# Patient Record
Sex: Male | Born: 1980
Health system: Southern US, Community
[De-identification: ages and names within clinical notes are randomized; demographics above are authoritative.]

## PROBLEM LIST (undated history)

## (undated) HISTORY — PX: APPENDECTOMY: SHX54

## (undated) HISTORY — PX: WISDOM TOOTH EXTRACTION: SHX21

---

## 2003-01-22 ENCOUNTER — Emergency Department (HOSPITAL_COMMUNITY): Admission: EM | Admit: 2003-01-22 | Discharge: 2003-01-22 | Payer: Self-pay | Admitting: Emergency Medicine

## 2004-02-15 ENCOUNTER — Emergency Department (HOSPITAL_COMMUNITY): Admission: EM | Admit: 2004-02-15 | Discharge: 2004-02-15 | Payer: Self-pay | Admitting: Emergency Medicine

## 2008-08-05 ENCOUNTER — Inpatient Hospital Stay (HOSPITAL_COMMUNITY): Admission: EM | Admit: 2008-08-05 | Discharge: 2008-08-06 | Payer: Self-pay | Admitting: Emergency Medicine

## 2008-08-05 ENCOUNTER — Encounter (INDEPENDENT_AMBULATORY_CARE_PROVIDER_SITE_OTHER): Payer: Self-pay

## 2010-12-27 NOTE — Op Note (Signed)
Trevor Simmons, Trevor Simmons NO.:  [id number]   MEDICAL RECORD NO.:  [id number]          PATIENT TYPE:  INP   LOCATION:  5028                         FACILITY:  [facility]   PHYSICIAN:  Sandria Bales. Ezzard Standing, M.D.  DATE OF BIRTH:  01-14-1981   DATE OF PROCEDURE:  08/04/2008  DATE OF DISCHARGE:                               OPERATIVE REPORT   Date of surgery ?   PREOPERATIVE DIAGNOSIS:  Acute appendicitis.   POSTOPERATIVE DIAGNOSIS:  Acute appendicitis.   PROCEDURE:  Laparoscopic appendectomy.   SURGEON:  Sandria Bales. Ezzard Standing, MD   FIRST ASSISTANT:  None.   ANESTHESIA:  General endotracheal with approximately 20 mL of 0.25%  Marcaine.   COMPLICATIONS:  None.   INDICATIONS FOR PROCEDURE:  [name] is a 30 year old Hispanic male  who developed abdominal pain over the last 24-36 hours, which has  localized to the right lower quadrant.  CT scan is consistent with acute  appendicitis.  The patient now comes for attempted laparoscopic  appendectomy.   Indications and potential complications of procedures were explained to  the patient.  Potential complication include, but limited bleeding,  infection, which I think he already has a bowel injury and open surgery.   OPERATIVE NOTE:  The patient was placed in a supine position, had his  left arm tucked to his side.  A Foley catheter in place.  His abdomen  was prepped with CHG and sterilely draped.  He was given 1 g of  cefoxitin.  A time-out was held identifying the patient and procedure.   I accessed the abdominal cavity through an infraumbilical incision with  sharp dissection carried down to the abdominal cavity.  I placed a 12 mm  Hasson tocar.  I placed a 5-mm trocar in the right upper quadrant and an  11-mm trocar in the left lower quadrant and proceeded with dissecting  out the appendix.   The appendix was stuck along the lateral aspect of the pelvic brim up  over to the right colon gutter and actually twisted under  the colon, got  actually free of adhesions just to untwist the appendix.  The distal tip  of the appendix was acutely inflamed consistent with acute appendicitis,  but there was no obvious purulence.   We took down the mesentery using a harmonic scalpel.  I then used a  vascular load of the Endo-GIA Ethicon 45-mm stapler to staple across the  base of the appendix.  I placed in EndoCatch bag and delivered through  the umbilicus.   I then irrigated the abdomen and pelvis and reinspected the appendiceal  stump, which looked good and healthy.  There was no bleeding.  I then  removed each trocar in turn.  I closed the umbilical port with a 0  Vicryl suture. I closed the skin edge site with a 5-0 Vicryl suture,  painted the wounds with the tincture of benzoin and Steri-Striped.  The  patient tolerated the procedure well.   He was transferred to recovery room in good condition.  Sponge and  needle count were correct at the end of case.  Sandria Bales. Ezzard Standing, M.D.  Electronically Signed     DHN/MEDQ  D:  08/05/2008  T:  08/05/2008  Job:  [id number]

## 2010-12-27 NOTE — H&P (Signed)
NAMEDELOYD, HANDY NO.:  [id number]   MEDICAL RECORD NO.:  [id number]          PATIENT TYPE:  INP   LOCATION:  1823                         FACILITY:  [facility]   PHYSICIAN:  Gabrielle Dare. Janee Morn, M.D.DATE OF BIRTH:  [birth year]   DATE OF ADMISSION:  08/04/2008  DATE OF DISCHARGE:                              HISTORY & PHYSICAL   CHIEF COMPLAINT:  Right lower quadrant abdominal pain.   HISTORY OF PRESENT ILLNESS:  Mr. Wild is a 30 year old Hispanic male  who developed generalized abdominal pain yesterday afternoon.  It  gradually worsened and localized to his right lower quadrant.  He had  some associated nausea and vomiting.  He came to [facility] Emergency  Department where further evaluation revealed white blood cell count of  14.9 and CT scan of the abdomen and pelvis was consistent with acute  appendicitis.  He continues to have some pain in the right lower  quadrant with associated nausea.   PAST MEDICAL HISTORY:  Negative.   PAST SURGICAL HISTORY:  Jaw surgery.   SOCIAL HISTORY:  He does not smoke or drink alcohol.  He denies drug  use.  He works [employer] representative.   ALLERGIES:  No known drug allergies.   FAMILY HISTORY:  Noncontributory.   CURRENT MEDICATIONS:  None.   REVIEW OF SYSTEMS:  GI complaints as above, right lower quadrant  abdominal pain with associated nausea and vomiting.  The remainder  review of systems was unremarkable.   PHYSICAL EXAMINATION:  VITAL SIGNS:  Temperature is 98.1, pulse is 69,  respirations is 18, and blood pressure is 104/54.  GENERAL:  He is awake and alert.  He is mildly anxious, but in no  distress.  HEENT:  Pupils are equal.  Sclerae are clear.  Oral mucous is moist.  NECK:  Supple with no masses or tenderness.  PULMONARY:  Lungs are clear to auscultation without significant  wheezing.  Respiratory effort is good.  CARDIOVASCULAR:  Heart is regular with no murmurs heard and pulses  palpable on the left chest.   Distal pulses are 2+ with no peripheral  edema.  ABDOMEN:  Soft.  He does have tenderness in the right lower quadrant  with voluntary guarding.  There is no generalized peritoneal signs.  There are few bowel sounds.  No masses or organomegaly are palpated.  SKIN:  Warm and dry.  No rashes are noted.  EXTREMITIES:  No deformity or tenderness.  NEUROLOGIC:  He is awake and alert.  Speech is fluent in [place].  He  moves all extremities without gross deficit.   LABORATORY STUDIES:  White blood cell count is 14.9, hemoglobin 13.1,  and platelets 209.  Sodium 139, potassium 3.3, chloride 103, CO2 24, BUN  15, creatinine 1.05, and glucose 130.   CT scan of the abdomen and pelvis demonstrates acute appendicitis.   IMPRESSION:  Acute appendicitis.   PLAN:  IV antibiotics, and we will plan laparoscopic appendectomy later  this morning when the OR is available.  Procedure, risks, and benefits  were discussed in detail with the patient and his wife in Spanish by  myself.  Questions were answered, and he is agreeable.      Gabrielle Dare Janee Morn, M.D.  Electronically Signed     BET/MEDQ  D:  08/05/2008  T:  08/05/2008  Job:  [id number]

## 2011-05-19 LAB — URINALYSIS, ROUTINE W REFLEX MICROSCOPIC
Glucose, UA: NEGATIVE mg/dL
Ketones, ur: >80 mg/dL — AB
Leukocytes, UA: NEGATIVE
Protein, ur: 30 mg/dL — AB
Urobilinogen, UA: 1 mg/dL (ref 0.0–1.0)

## 2011-05-19 LAB — COMPREHENSIVE METABOLIC PANEL
ALT: 13 U/L (ref 0–53)
Alkaline Phosphatase: 41 U/L (ref 39–117)
BUN: 15 mg/dL (ref 6–23)
CO2: 24 mEq/L (ref 19–32)
Chloride: 103 mEq/L (ref 96–112)
GFR calc non Af Amer: >60 mL/min (ref >60)
Glucose, Bld: 130 mg/dL — ABNORMAL HIGH (ref 70–99)
Potassium: 3.3 mEq/L — ABNORMAL LOW (ref 3.5–5.1)
Sodium: 139 mEq/L (ref 135–145)
Total Bilirubin: 1.3 mg/dL — ABNORMAL HIGH (ref 0.3–1.2)

## 2011-05-19 LAB — DIFFERENTIAL
Basophils Absolute: 0 10*3/uL (ref 0.0–0.1)
Basophils Relative: 0 % (ref 0–1)
Eosinophils Absolute: 0 10*3/uL (ref 0.0–0.7)
Monocytes Relative: 3 % (ref 3–12)
Neutro Abs: 13.5 10*3/uL — ABNORMAL HIGH (ref 1.7–7.7)
Neutrophils Relative %: 91 % — ABNORMAL HIGH (ref 43–77)

## 2011-05-19 LAB — URINE MICROSCOPIC-ADD ON

## 2011-05-19 LAB — CBC
HCT: 44.8 % (ref 39.0–52.0)
Hemoglobin: 15.1 g/dL (ref 13.0–17.0)
RBC: 5.31 MIL/uL (ref 4.22–5.81)

## 2017-08-19 ENCOUNTER — Encounter (HOSPITAL_COMMUNITY)
Admission: EM | Disposition: A | Discharge status: Home / Self Care | Payer: Self-pay | Source: Home / Self Care | Attending: Emergency Medicine

## 2017-08-19 ENCOUNTER — Encounter (HOSPITAL_COMMUNITY): Payer: Self-pay

## 2017-08-19 ENCOUNTER — Ambulatory Visit (HOSPITAL_COMMUNITY)
Admission: EM | Admit: 2017-08-19 | Discharge: 2017-08-19 | Disposition: A | Discharge status: Home / Self Care | Payer: Self-pay | Attending: Orthopedic Surgery | Admitting: Orthopedic Surgery

## 2017-08-19 ENCOUNTER — Emergency Department (HOSPITAL_COMMUNITY): Payer: Self-pay | Admitting: Certified Registered Nurse Anesthetist

## 2017-08-19 ENCOUNTER — Emergency Department (HOSPITAL_COMMUNITY): Payer: Self-pay

## 2017-08-19 DIAGNOSIS — S66326A Laceration of extensor muscle, fascia and tendon of right little finger at wrist and hand level, initial encounter: Secondary | ICD-10-CM | POA: Insufficient documentation

## 2017-08-19 DIAGNOSIS — Y9289 Other specified places as the place of occurrence of the external cause: Secondary | ICD-10-CM | POA: Insufficient documentation

## 2017-08-19 DIAGNOSIS — B957 Other staphylococcus as the cause of diseases classified elsewhere: Secondary | ICD-10-CM | POA: Insufficient documentation

## 2017-08-19 DIAGNOSIS — T148XXA Other injury of unspecified body region, initial encounter: Secondary | ICD-10-CM

## 2017-08-19 DIAGNOSIS — S61219A Laceration without foreign body of unspecified finger without damage to nail, initial encounter: Secondary | ICD-10-CM

## 2017-08-19 DIAGNOSIS — S61216A Laceration without foreign body of right little finger without damage to nail, initial encounter: Secondary | ICD-10-CM

## 2017-08-19 DIAGNOSIS — Y990 Civilian activity done for income or pay: Secondary | ICD-10-CM | POA: Insufficient documentation

## 2017-08-19 DIAGNOSIS — L089 Local infection of the skin and subcutaneous tissue, unspecified: Secondary | ICD-10-CM

## 2017-08-19 DIAGNOSIS — W260XXA Contact with knife, initial encounter: Secondary | ICD-10-CM | POA: Insufficient documentation

## 2017-08-19 DIAGNOSIS — S62616B Displaced fracture of proximal phalanx of right little finger, initial encounter for open fracture: Secondary | ICD-10-CM | POA: Insufficient documentation

## 2017-08-19 DIAGNOSIS — Y939 Activity, unspecified: Secondary | ICD-10-CM | POA: Insufficient documentation

## 2017-08-19 HISTORY — PX: I&D EXTREMITY: SHX5045

## 2017-08-19 LAB — CBC WITH DIFFERENTIAL/PLATELET
Basophils Absolute: 0.1 10*3/uL (ref 0.0–0.1)
Basophils Relative: 1 %
EOS ABS: 0.5 10*3/uL (ref 0.0–0.7)
Eosinophils Relative: 5 %
HCT: 41.7 % (ref 39.0–52.0)
HEMOGLOBIN: 14 g/dL (ref 13.0–17.0)
Lymphocytes Relative: 24 %
Lymphs Abs: 2.1 10*3/uL (ref 0.7–4.0)
MCH: 28.3 pg (ref 26.0–34.0)
MCHC: 33.6 g/dL (ref 30.0–36.0)
MCV: 84.2 fL (ref 78.0–100.0)
MONO ABS: 1 10*3/uL (ref 0.1–1.0)
MONOS PCT: 11 %
NEUTROS PCT: 59 %
Neutro Abs: 5.4 10*3/uL (ref 1.7–7.7)
Platelets: 191 10*3/uL (ref 150–400)
RBC: 4.95 MIL/uL (ref 4.22–5.81)
RDW: 12.5 % (ref 11.5–15.5)
WBC: 8.9 10*3/uL (ref 4.0–10.5)

## 2017-08-19 LAB — BASIC METABOLIC PANEL
Anion gap: 9 (ref 5–15)
BUN: 13 mg/dL (ref 6–20)
CHLORIDE: 101 mmol/L (ref 101–111)
CO2: 24 mmol/L (ref 22–32)
CREATININE: 0.9 mg/dL (ref 0.61–1.24)
Calcium: 8.9 mg/dL (ref 8.9–10.3)
GFR calc Af Amer: >60 mL/min (ref >60)
GFR calc non Af Amer: >60 mL/min (ref >60)
Glucose, Bld: 104 mg/dL — ABNORMAL HIGH (ref 65–99)
Potassium: 3.7 mmol/L (ref 3.5–5.1)
SODIUM: 134 mmol/L — AB (ref 135–145)

## 2017-08-19 SURGERY — IRRIGATION AND DEBRIDEMENT EXTREMITY
Anesthesia: General | Site: Arm Lower | Laterality: Right

## 2017-08-19 MED ORDER — CEFAZOLIN SODIUM-DEXTROSE 2-4 GM/100ML-% IV SOLN
2.0000 g | Freq: Once | INTRAVENOUS | Status: AC
Start: 2017-08-19 — End: 2017-08-19
  Administered 2017-08-19: 2 g via INTRAVENOUS
  Filled 2017-08-19: qty 100

## 2017-08-19 MED ORDER — HYDROCODONE-ACETAMINOPHEN 5-325 MG PO TABS: ORAL_TABLET | ORAL | 0 refills | Status: DC

## 2017-08-19 MED ORDER — PROPOFOL 10 MG/ML IV BOLUS
INTRAVENOUS | Status: DC | PRN
  Administered 2017-08-19: 200 mg via INTRAVENOUS

## 2017-08-19 MED ORDER — LIDOCAINE HCL (CARDIAC) 20 MG/ML IV SOLN
INTRAVENOUS | Status: DC | PRN
  Administered 2017-08-19: 60 mg via INTRAVENOUS

## 2017-08-19 MED ORDER — FENTANYL CITRATE (PF) 100 MCG/2ML IJ SOLN
INTRAMUSCULAR | Status: DC | PRN
  Administered 2017-08-19 (×2): 25 ug via INTRAVENOUS
  Administered 2017-08-19: 50 ug via INTRAVENOUS

## 2017-08-19 MED ORDER — TETANUS-DIPHTH-ACELL PERTUSSIS 5-2.5-18.5 LF-MCG/0.5 IM SUSP
0.5000 mL | Freq: Once | INTRAMUSCULAR | Status: AC
  Administered 2017-08-19: 0.5 mL via INTRAMUSCULAR
  Filled 2017-08-19: qty 0.5

## 2017-08-19 MED ORDER — FENTANYL CITRATE (PF) 250 MCG/5ML IJ SOLN
INTRAMUSCULAR | Status: AC
  Filled 2017-08-19: qty 5

## 2017-08-19 MED ORDER — BUPIVACAINE HCL (PF) 0.25 % IJ SOLN
INTRAMUSCULAR | Status: DC | PRN
  Administered 2017-08-19: 10 mL

## 2017-08-19 MED ORDER — PROMETHAZINE HCL 25 MG/ML IJ SOLN: 6.2500 mg | INTRAMUSCULAR | Status: DC | PRN

## 2017-08-19 MED ORDER — ONDANSETRON HCL 4 MG/2ML IJ SOLN
INTRAMUSCULAR | Status: DC | PRN
  Administered 2017-08-19: 4 mg via INTRAVENOUS

## 2017-08-19 MED ORDER — OXYCODONE-ACETAMINOPHEN 5-325 MG PO TABS
1.0000 | ORAL_TABLET | Freq: Once | ORAL | Status: AC
  Administered 2017-08-19: 1 via ORAL
  Filled 2017-08-19: qty 1

## 2017-08-19 MED ORDER — SULFAMETHOXAZOLE-TRIMETHOPRIM 800-160 MG PO TABS: 1.0000 | ORAL_TABLET | Freq: Two times a day (BID) | ORAL | 0 refills | Status: DC

## 2017-08-19 MED ORDER — PROPOFOL 10 MG/ML IV BOLUS
INTRAVENOUS | Status: AC
  Filled 2017-08-19: qty 20

## 2017-08-19 MED ORDER — 0.9 % SODIUM CHLORIDE (POUR BTL) OPTIME
TOPICAL | Status: DC | PRN
  Administered 2017-08-19: 1000 mL

## 2017-08-19 MED ORDER — SODIUM CHLORIDE 0.9 % IR SOLN
Status: DC | PRN
  Administered 2017-08-19: 3000 mL

## 2017-08-19 MED ORDER — MIDAZOLAM HCL 5 MG/5ML IJ SOLN
INTRAMUSCULAR | Status: DC | PRN
  Administered 2017-08-19: 2 mg via INTRAVENOUS

## 2017-08-19 MED ORDER — CEFAZOLIN SODIUM-DEXTROSE 1-4 GM/50ML-% IV SOLN
INTRAVENOUS | Status: DC | PRN
  Administered 2017-08-19: 2 g via INTRAVENOUS

## 2017-08-19 MED ORDER — MIDAZOLAM HCL 2 MG/2ML IJ SOLN
INTRAMUSCULAR | Status: AC
  Filled 2017-08-19: qty 2

## 2017-08-19 MED ORDER — LACTATED RINGERS IV SOLN
INTRAVENOUS | Status: DC | PRN
  Administered 2017-08-19: 11:00:00 via INTRAVENOUS

## 2017-08-19 MED ORDER — BUPIVACAINE HCL (PF) 0.5 % IJ SOLN
10.0000 mL | Freq: Once | INTRAMUSCULAR | Status: AC
  Administered 2017-08-19: 10 mL
  Filled 2017-08-19 (×2): qty 10

## 2017-08-19 MED ORDER — HYDROMORPHONE HCL 1 MG/ML IJ SOLN: 0.2500 mg | INTRAMUSCULAR | Status: DC | PRN

## 2017-08-19 SURGICAL SUPPLY — 48 items
BANDAGE ACE 3X5.8 VEL STRL LF (GAUZE/BANDAGES/DRESSINGS) ×3 IMPLANT
BANDAGE ACE 4X5 VEL STRL LF (GAUZE/BANDAGES/DRESSINGS) ×3 IMPLANT
BANDAGE COBAN STERILE 2 (GAUZE/BANDAGES/DRESSINGS) IMPLANT
BNDG COHESIVE 1X5 TAN STRL LF (GAUZE/BANDAGES/DRESSINGS) ×3 IMPLANT
BNDG ESMARK 4X9 LF (GAUZE/BANDAGES/DRESSINGS) IMPLANT
BNDG GAUZE ELAST 4 BULKY (GAUZE/BANDAGES/DRESSINGS) ×3 IMPLANT
CORDS BIPOLAR (ELECTRODE) ×3 IMPLANT
COVER SURGICAL LIGHT HANDLE (MISCELLANEOUS) ×3 IMPLANT
CUFF TOURNIQUET SINGLE 18IN (TOURNIQUET CUFF) IMPLANT
CUFF TOURNIQUET SINGLE 24IN (TOURNIQUET CUFF) IMPLANT
DECANTER SPIKE VIAL GLASS SM (MISCELLANEOUS) ×3 IMPLANT
DRAIN PENROSE 1/4X12 LTX STRL (WOUND CARE) IMPLANT
DRSG PAD ABDOMINAL 8X10 ST (GAUZE/BANDAGES/DRESSINGS) ×6 IMPLANT
GAUZE PACKING IODOFORM 1/4X15 (GAUZE/BANDAGES/DRESSINGS) ×3 IMPLANT
GAUZE SPONGE 4X4 12PLY STRL (GAUZE/BANDAGES/DRESSINGS) ×3 IMPLANT
GAUZE SPONGE 4X4 16PLY XRAY LF (GAUZE/BANDAGES/DRESSINGS) ×3 IMPLANT
GAUZE XEROFORM 1X8 LF (GAUZE/BANDAGES/DRESSINGS) ×3 IMPLANT
GLOVE BIO SURGEON STRL SZ7.5 (GLOVE) ×3 IMPLANT
GLOVE BIOGEL PI IND STRL 8 (GLOVE) ×1 IMPLANT
GLOVE BIOGEL PI INDICATOR 8 (GLOVE) ×2
GOWN STRL REUS W/ TWL LRG LVL3 (GOWN DISPOSABLE) ×1 IMPLANT
GOWN STRL REUS W/ TWL XL LVL3 (GOWN DISPOSABLE) ×1 IMPLANT
GOWN STRL REUS W/TWL LRG LVL3 (GOWN DISPOSABLE) ×2
GOWN STRL REUS W/TWL XL LVL3 (GOWN DISPOSABLE) ×2
KIT BASIN OR (CUSTOM PROCEDURE TRAY) ×3 IMPLANT
KIT ROOM TURNOVER OR (KITS) ×3 IMPLANT
LOOP VESSEL MAXI BLUE (MISCELLANEOUS) IMPLANT
MANIFOLD NEPTUNE II (INSTRUMENTS) IMPLANT
NEEDLE HYPO 25X1 1.5 SAFETY (NEEDLE) IMPLANT
NS IRRIG 1000ML POUR BTL (IV SOLUTION) ×3 IMPLANT
PACK ORTHO EXTREMITY (CUSTOM PROCEDURE TRAY) ×3 IMPLANT
PAD ARMBOARD 7.5X6 YLW CONV (MISCELLANEOUS) ×6 IMPLANT
SCRUB BETADINE 4OZ XXX (MISCELLANEOUS) ×3 IMPLANT
SET CYSTO W/LG BORE CLAMP LF (SET/KITS/TRAYS/PACK) IMPLANT
SOL PREP POV-IOD 4OZ 10% (MISCELLANEOUS) ×3 IMPLANT
SPONGE LAP 4X18 X RAY DECT (DISPOSABLE) ×3 IMPLANT
SUT ETHILON 4 0 P 3 18 (SUTURE) ×3 IMPLANT
SUT ETHILON 4 0 PS 2 18 (SUTURE) IMPLANT
SUT MON AB 5-0 P3 18 (SUTURE) IMPLANT
SWAB COLLECTION DEVICE MRSA (MISCELLANEOUS) IMPLANT
SWAB CULTURE ESWAB REG 1ML (MISCELLANEOUS) IMPLANT
SYR CONTROL 10ML LL (SYRINGE) IMPLANT
TOWEL OR 17X26 10 PK STRL BLUE (TOWEL DISPOSABLE) ×3 IMPLANT
TUBE CONNECTING 12'X1/4 (SUCTIONS) ×1
TUBE CONNECTING 12X1/4 (SUCTIONS) ×2 IMPLANT
TUBE FEEDING ENTERAL 5FR 16IN (TUBING) IMPLANT
UNDERPAD 30X30 (UNDERPADS AND DIAPERS) ×3 IMPLANT
YANKAUER SUCT BULB TIP NO VENT (SUCTIONS) ×3 IMPLANT

## 2017-08-19 NOTE — Anesthesia Procedure Notes (Signed)
Procedure Name: LMA Insertion Date/Time: 08/19/2017 11:27 AM Performed by: Dairl PonderJiang, Jonessa Triplett, CRNA Pre-anesthesia Checklist: Patient identified, Emergency Drugs available, Suction available, Patient being monitored and Timeout performed Patient Re-evaluated:Patient Re-evaluated prior to induction Oxygen Delivery Method: Circle system utilized Preoxygenation: Pre-oxygenation with 100% oxygen Induction Type: IV induction LMA: LMA inserted LMA Size: 4.0 Number of attempts: 1 Placement Confirmation: positive ETCO2 and breath sounds checked- equal and bilateral Tube secured with: Tape Dental Injury: Teeth and Oropharynx as per pre-operative assessment

## 2017-08-19 NOTE — Discharge Instructions (Addendum)

## 2017-08-19 NOTE — H&P (Addendum)
Trevor Simmons is an 37 y.o. male.   Chief Complaint: right small finger laceration HPI: 37 yo rhd male states he lacerated right small five days ago on knife at work.  Did not seek medical attention at that time.  Presented to ED this morning with pain in finger.  He speaks good english.  Describes throbbing pain in right small finger with associated swelling.  Alleviated with digital block.  Aggravated by palpation.  No fevers, chills, night sweats.  Case discussed with Charlann Lange, [facility] and her note from 08/19/2017 reviewed. Xrays viewed and interpreted by me: 3 views right small finger show no fractures, dislocations, foreign body noted proximal to laceration in hand.  No signs osteomyelitis. Labs reviewed: WBC 8.9  Allergies: No Known Allergies  History reviewed. No pertinent past medical history.  Past Surgical History:  Procedure Laterality Date  . APPENDECTOMY      Family History: No family history on file.  Social History:   reports that  has never smoked. he has never used smokeless tobacco. He reports that he does not drink alcohol or use drugs.  Medications:  (Not in a hospital admission)  Results for orders placed or performed during the hospital encounter of 08/19/17 (from the past 48 hour(s))  CBC with Differential     Status: None   Collection Time: 08/19/17  6:49 AM  Result Value Ref Range   WBC 8.9 4.0 - 10.5 K/uL   RBC 4.95 4.22 - 5.81 MIL/uL   Hemoglobin 14.0 13.0 - 17.0 g/dL   HCT 41.7 39.0 - 52.0 %   MCV 84.2 78.0 - 100.0 fL   MCH 28.3 26.0 - 34.0 pg   MCHC 33.6 30.0 - 36.0 g/dL   RDW 12.5 11.5 - 15.5 %   Platelets 191 150 - 400 K/uL   Neutrophils Relative % 59 %   Neutro Abs 5.4 1.7 - 7.7 K/uL   Lymphocytes Relative 24 %   Lymphs Abs 2.1 0.7 - 4.0 K/uL   Monocytes Relative 11 %   Monocytes Absolute 1.0 0.1 - 1.0 K/uL   Eosinophils Relative 5 %   Eosinophils Absolute 0.5 0.0 - 0.7 K/uL   Basophils Relative 1 %   Basophils Absolute 0.1 0.0 -  0.1 K/uL  Basic metabolic panel     Status: Abnormal   Collection Time: 08/19/17  6:49 AM  Result Value Ref Range   Sodium 134 (L) 135 - 145 mmol/L   Potassium 3.7 3.5 - 5.1 mmol/L   Chloride 101 101 - 111 mmol/L   CO2 24 22 - 32 mmol/L   Glucose, Bld 104 (H) 65 - 99 mg/dL   BUN 13 6 - 20 mg/dL   Creatinine, Ser 0.90 0.61 - 1.24 mg/dL   Calcium 8.9 8.9 - 10.3 mg/dL   GFR calc non Af Amer >60 >60 mL/min   GFR calc Af Amer >60 >60 mL/min    Comment: (NOTE) The eGFR has been calculated using the CKD EPI equation. This calculation has not been validated in all clinical situations. eGFR's persistently <60 mL/min signify possible Chronic Kidney Disease.    Anion gap 9 5 - 15    No results found.   A comprehensive review of systems was negative except for: Gastrointestinal: positive for nausea Review of Systems: No fevers, chills, night sweats, chest pain, shortness of breath, vomiting, diarrhea, constipation, easy bleeding or bruising, headaches, dizziness, vision changes, fainting.   Blood pressure 103/76, pulse 72, temperature 98.2 F (36.8 C), temperature  source Oral, resp. rate 16, SpO2 100 %.  General appearance: alert, cooperative and appears stated age Head: Normocephalic, without obvious abnormality, atraumatic Neck: supple, symmetrical, trachea midline Resp: clear to auscultation bilaterally Cardio: regular rate and rhythm GI: non-tender Extremities: Intact sensation and capillary refill all digits.  +epl/fpl/io.  Laceration dorsum right small finger at level of PIP joint.  Swelling and mild erythema surrounding.  Purulence coming from wound.  No proximal streaking.  Tender at PIP joint.  Able to move digit limited by swelling.   Pulses: 2+ and symmetric Skin: Skin color, texture, turgor normal. No rashes or lesions Neurologic: Grossly normal Incision/Wound: As above  Assessment/Plan Right small finger infection possibly involving PIP joint.  Recommend OR for incision  and drainage.  Risks, benefits and alternatives of surgery were discussed including risks of blood loss, infection, damage to nerves/vessels/tendons/ligament/bone, failure of surgery, need for additional surgery, complication with wound healing, need for repeat incision and drainage, stiffness.  He voiced understanding of these risks and elected to proceed.    KUZMA,KEVIN R 08/19/2017, 10:38 AM

## 2017-08-19 NOTE — Anesthesia Preprocedure Evaluation (Addendum)
Anesthesia Evaluation  Patient identified by MRN, date of birth, ID band Patient awake    Reviewed: Allergy & Precautions, NPO status , Patient's Chart, lab work & pertinent test results  Airway Mallampati: II  TM Distance: >3 FB Neck ROM: Full    Dental  (+) Dental Advisory Given   Pulmonary neg pulmonary ROS,    breath sounds clear to auscultation       Cardiovascular negative cardio ROS   Rhythm:Regular Rate:Normal     Neuro/Psych negative neurological ROS     GI/Hepatic negative GI ROS,   Endo/Other    Renal/GU negative Renal ROS     Musculoskeletal Right small finger infection   Abdominal   Peds  Hematology negative hematology ROS (+)   Anesthesia Other Findings   Reproductive/Obstetrics                             Anesthesia Physical Anesthesia Plan  ASA: II  Anesthesia Plan: General   Post-op Pain Management:    Induction: Intravenous  PONV Risk Score and Plan: 2 and Ondansetron, Dexamethasone and Treatment may vary due to age or medical condition  Airway Management Planned: LMA and Oral ETT  Additional Equipment:   Intra-op Plan:   Post-operative Plan: Extubation in OR  Informed Consent: I have reviewed the patients History and Physical, chart, labs and discussed the procedure including the risks, benefits and alternatives for the proposed anesthesia with the patient or authorized representative who has indicated his/her understanding and acceptance.   Dental advisory given  Plan Discussed with: CRNA  Anesthesia Plan Comments:        Anesthesia Quick Evaluation

## 2017-08-19 NOTE — Op Note (Signed)
NAMESHAMEER, MOLSTAD NO.:  [id number]  MEDICAL RECORD NO.:  [id number]  LOCATION:  P11C                         FACILITY:  [facility]  PHYSICIAN:  Betha Loa, MD        DATE OF BIRTH:  1981-08-03  DATE OF PROCEDURE:  08/19/2017 DATE OF DISCHARGE:                              OPERATIVE REPORT   PREOPERATIVE DIAGNOSIS:  Right small finger laceration with abscess.  POSTOPERATIVE DIAGNOSIS:  Right small finger laceration with open proximal phalanx condyle fracture and infection including proximal interphalangeal joint.  PROCEDURE:  Incision and drainage of right small finger abscess with irrigation and debridement of open proximal phalanx fracture and PIP joint.  SURGEON:  Betha Loa, MD.  ASSISTANT:  None.  ANESTHESIA:  General.  IV FLUIDS:  Per anesthesia flow sheet.  ESTIMATED BLOOD LOSS:  Minimal.  COMPLICATIONS:  None.  SPECIMENS:  Cultures to Micro.  TOURNIQUET TIME:  13 minutes.  DISPOSITION:  Stable to PACU.  INDICATIONS:  Mr. Wherry is a 37 year old right-hand dominant male, who states 5 days ago while at work he sustained a laceration to his right small finger from a utility knife.  He did not seek any medical treatment initially.  He presented to the emergency department early this morning with increasing pain and swelling of the finger.  On examination by the emergency department, there was purulence within the wound.  I was consulted for management of the injury.  I recommended incision and drainage in the operating room to possible include the PIP joint.  Risks, benefits, and alternatives of the surgery were discussed including risks of blood loss, infection, damage to nerves, vessels, tendons, ligaments, and bone, failure of surgery, need for additional surgery, complications with wound healing, continued pain, and need for further surgery.  He voiced understanding these risks and elected to proceed.  OPERATIVE COURSE:  After being  identified preoperatively by myself, the patient and I agreed upon the procedure and site of procedure.  Surgical site was marked.  The risks, benefits, and alternatives of surgery were reviewed and he wished to proceed.  Surgical consent had been signed. He had been given antibiotics in the emergency department.  Additional antibiotics were held for cultures.  He was transferred to the operating room and placed on the operating room table in supine position with the right upper extremity on an arm board.  General anesthesia was induced by anesthesiologist.  Right upper extremity was prepped and draped in normal sterile orthopedic fashion.  A surgical pause was performed between surgeons, Anesthesia, and operating room staff; and all were in agreement as to the patient procedure and site of procedure.  Tourniquet at the proximal aspect of the extremity was inflated to 250 mmHg after exsanguination of the limb with an Esmarch bandage.  The wound was opened.  There was a small amount of purulence within.  Cultures were taken for aerobes and anaerobes.  There was noted to be laceration of the ulnar side of the extensor tendon over the distal aspect of the proximal phalanx.  The wound was debrided of some hematoma and the skin edges sharply debrided with the scissors including the dermis and some subcutaneous tissues.  This tissue  was removed.  The PIP joint was exposed.  There was laceration over the dorsal aspect of the ulnar condyle of the proximal phalanx.  The wound and open proximal phalanx fracture of the PIP joint were copiously irrigated with sterile saline by cysto tubing.  Single suture was placed proximally in the wound to prevent the flap from flipping back over on itself as this was a distally-based L-shaped flap.  The wound and PIP joint were packed with quarter-inch iodoform gauze, which exited on the ulnar side.  A digital block was performed with 10 mL of 0.25% plain Marcaine  to aid in postoperative analgesia.  He was given a dose of IV Ancef in the operating suite after cultures had been taken.  The wound was dressed with sterile 4 x 4's and wrapped with a Kerlix Coban dressing lightly. Alumafoam splint was placed and wrapped lightly with Coban dressing. Tourniquet was deflated at 13 minutes.  Fingertips were pink with brisk capillary refill after deflation of tourniquet.  The operative drapes were broken down and the patient was awakened from anesthesia safely. He was transferred back to stretcher and taken to PACU in stable condition.  I will see him back in the office in approximately 3 days for postoperative followup.  I will give him a prescription for Norco 5/325 one to two p.o. q.6 hours p.r.n. pain dispensed #20 and Bactrim DS 1 p.o. b.i.d. x7 days.     Betha LoaKevin Danilo Cappiello, MD     KK/MEDQ  D:  08/19/2017  T:  08/19/2017  Job:  [id number]

## 2017-08-19 NOTE — Op Note (Signed)
[id number] 

## 2017-08-19 NOTE — ED Provider Notes (Signed)
[facility] Provider Note   CSN: [id number] Arrival date & time: 08/19/17  [id number]     History   Chief Complaint Chief Complaint  Patient presents with  . Laceration    HPI Trevor Simmons is a 37 y.o. male.  Patient presents with finger laceration to right 5th finger that he sustained while at work 5 days ago. He presents tonight with increased pain and swelling. Laceration is located over the 5th PIP joint. He states it keeps getting knocked open and bleeds but denies seeing any pus. No fever, nausea, vomiting. Moving the finger is very painful which he states is worse when he walks with his arm at his side. No other complaints. He is not UTD on tetanus.   The history is provided by the patient and the spouse. No language interpreter was used.  Laceration      History reviewed. No pertinent past medical history.  There are no active problems to display for this patient.   Past Surgical History:  Procedure Laterality Date  . APPENDECTOMY         Home Medications    Prior to Admission medications   Not on File    Family History No family history on file.  Social History Social History   Tobacco Use  . Smoking status: Never Smoker  . Smokeless tobacco: Never Used  Substance Use Topics  . Alcohol use: No    Frequency: Never  . Drug use: No     Allergies   Patient has no known allergies.   Review of Systems Review of Systems  Constitutional: Negative for fever.  Musculoskeletal:       See HPI.  Skin: Positive for wound. Negative for color change.  Neurological: Negative for numbness.     Physical Exam Updated Vital Signs BP 125/76 (BP Location: Right Arm)   Pulse 77   Temp 98.1 F (36.7 C) (Oral)   Resp 15   SpO2 100%   Physical Exam  Constitutional: He is oriented to person, place, and time. He appears well-developed and well-nourished. No distress.  Neck: Normal range of motion.    Cardiovascular: Normal rate.  Pulmonary/Chest: Effort normal.  Musculoskeletal:  See Skin examination  Neurological: He is alert and oriented to person, place, and time.  Skin: Skin is warm and dry. Capillary refill takes less than 2 seconds.  Right 5th finger is moderately swollen over the proximal phalanx. Minimal erythema. Patient cannot move through ROM with flexion or extension and the finger is generally tender. Minimal tenderness to hand dorsum and palm. No red streaking into the hand.      ED Treatments / Results  Labs (all labs ordered are listed, but only abnormal results are displayed) Labs Reviewed - No data to display  EKG  EKG Interpretation None       Radiology No results found.  Procedures .Marland Kitchen.Laceration Repair Date/Time: 08/19/2017 6:50 AM Performed by: Elpidio AnisUpstill, Jhayla Podgorski, PA-C Authorized by: Elpidio AnisUpstill, Caitlin Hillmer, PA-C   Consent:    Consent obtained:  Verbal   Consent given by:  Patient   Risks discussed:  Tendon damage, retained foreign body and need for additional repair Anesthesia (see MAR for exact dosages):    Anesthesia method:  Local infiltration   Local anesthetic:  Bupivacaine 0.5% w/o epi Laceration details:    Location:  Finger   Finger location:  R small finger   Length (cm):  1.5 Comments:     Plan for cleaning and splinting  finger for outpatient follow up. During wound cleaning, purulent material is visualized coming from the wound. Dr. Merlyn Lot is consulted and procedure was stopped due to need to go to the OR for wash out.    (including critical care time)  Medications Ordered in ED Medications  bupivacaine (MARCAINE) 0.5 % injection 10 mL (not administered)  oxyCODONE-acetaminophen (PERCOCET/ROXICET) 5-325 MG per tablet 1 tablet (1 tablet Oral Given 08/19/17 0602)  Tdap (BOOSTRIX) injection 0.5 mL (0.5 mLs Intramuscular Given 08/19/17 0602)     Initial Impression / Assessment and Plan / ED Course  I have reviewed the triage vital signs and the  nursing notes.  Pertinent labs & imaging results that were available during my care of the patient were reviewed by me and considered in my medical decision making (see chart for details).     Patient here for evaluation of increasing right 5th finger pain and swelling since sustaining a dorsal laceration 5 days ago. No fever.   The has increased but not full ROM to flexion and extension after digital block. When would was being cleaned a purulent discharge was seen. Dr. Merlyn Lot was consulted and advises he would be in to see the patient, provide consult and take him to the OR later this morning. NPO orders entered. 1 gm Ancef ordered. Patient and family updated. Tetanus updated.   Final Clinical Impressions(s) / ED Diagnoses   Final diagnoses:  None   1. Right 5th finger laceration, delayed presentation 2. Wound infection  ED Discharge Orders    None       Elpidio Anis, Cordelia Poche 08/19/17 [id number]    Dione Booze, MD 08/19/17 [phone]

## 2017-08-19 NOTE — ED Triage Notes (Signed)
Pt states that he cut his R pinky finger on the 1st with a knife at work, last tetanus unknown, denies fevers.

## 2017-08-19 NOTE — Brief Op Note (Signed)
08/19/2017  11:53 AM  PATIENT:  Trevor Simmons  37 y.o. male  PRE-OPERATIVE DIAGNOSIS: right small finger infection  POST-OPERATIVE DIAGNOSIS: right small finger infection  PROCEDURE:  Procedure(s): IRRIGATION AND DEBRIDEMENT SMALL FINGER (Right)  SURGEON:  Surgeon(s) and Role:    Betha Loa* Sarah-Jane Nazario, MD - Primary  PHYSICIAN ASSISTANT:   ASSISTANTS: none   ANESTHESIA:   general  EBL:  Minimal  BLOOD ADMINISTERED:none  DRAINS: iodoform packing  LOCAL MEDICATIONS USED:  MARCAINE     SPECIMEN:  Source of Specimen:  right small finger  DISPOSITION OF SPECIMEN:  micro  COUNTS:  YES  TOURNIQUET:  Right arm: 13 minutes at 250 mmHg  DICTATION: .Other Dictation: Dictation Number [id number]  PLAN OF CARE: Discharge to home after PACU  PATIENT DISPOSITION:  PACU - hemodynamically stable.

## 2017-08-19 NOTE — Anesthesia Postprocedure Evaluation (Signed)
Anesthesia Post Note  Patient: Trevor Simmons  Procedure(s) Performed: IRRIGATION AND DEBRIDEMENT SMALL FINGER (Right Arm Lower)     Patient location during evaluation: PACU Anesthesia Type: General Level of consciousness: awake and alert Pain management: pain level controlled Vital Signs Assessment: post-procedure vital signs reviewed and stable Respiratory status: spontaneous breathing, nonlabored ventilation, respiratory function stable and patient connected to nasal cannula oxygen Cardiovascular status: blood pressure returned to baseline and stable Postop Assessment: no apparent nausea or vomiting Anesthetic complications: no    Last Vitals:  Vitals:   08/19/17 1301 08/19/17 1303  BP:  106/64  Pulse: (!) 53 (!) 55  Resp: 10 16  Temp:  (!) 36.3 C  SpO2: 100% 100%    Last Pain:  Vitals:   08/19/17 1303  TempSrc:   PainSc: 0-No pain                 Kennieth RadFitzgerald, Rawlin Reaume E

## 2017-08-19 NOTE — Transfer of Care (Signed)
Immediate Anesthesia Transfer of Care Note  Patient: Trevor Simmons  Procedure(s) Performed: IRRIGATION AND DEBRIDEMENT SMALL FINGER (Right Arm Lower)  Patient Location: PACU  Anesthesia Type:General  Level of Consciousness: drowsy and patient cooperative  Airway & Oxygen Therapy: Patient Spontanous Breathing and Patient connected to nasal cannula oxygen  Post-op Assessment: Report given to RN and Post -op Vital signs reviewed and stable  Post vital signs: Reviewed and stable  Last Vitals:  Vitals:   08/19/17 1030 08/19/17 1209  BP: 103/76   Pulse: 72 (!) 54  Resp:  11  Temp:    SpO2: 100% 100%    Last Pain:  Vitals:   08/19/17 0826  TempSrc: Oral  PainSc:          Complications: No apparent anesthesia complications

## 2017-08-20 ENCOUNTER — Encounter (HOSPITAL_COMMUNITY): Payer: Self-pay | Admitting: Orthopedic Surgery

## 2017-08-24 LAB — AEROBIC/ANAEROBIC CULTURE W GRAM STAIN (SURGICAL/DEEP WOUND)

## 2017-08-24 LAB — AEROBIC/ANAEROBIC CULTURE (SURGICAL/DEEP WOUND)

## 2019-08-29 ENCOUNTER — Ambulatory Visit: Payer: Self-pay | Attending: Nurse Practitioner | Admitting: Nurse Practitioner

## 2019-08-29 ENCOUNTER — Encounter: Payer: Self-pay | Admitting: Nurse Practitioner

## 2019-08-29 ENCOUNTER — Other Ambulatory Visit: Payer: Self-pay

## 2019-08-29 DIAGNOSIS — Z13220 Encounter for screening for lipoid disorders: Secondary | ICD-10-CM

## 2019-08-29 DIAGNOSIS — Z131 Encounter for screening for diabetes mellitus: Secondary | ICD-10-CM

## 2019-08-29 DIAGNOSIS — R21 Rash and other nonspecific skin eruption: Secondary | ICD-10-CM

## 2019-08-29 DIAGNOSIS — Z7689 Persons encountering health services in other specified circumstances: Secondary | ICD-10-CM

## 2019-08-29 DIAGNOSIS — Z13228 Encounter for screening for other metabolic disorders: Secondary | ICD-10-CM

## 2019-08-29 DIAGNOSIS — Z130 Encounter for screening for diseases of the blood and blood-forming organs and certain disorders involving the immune mechanism: Secondary | ICD-10-CM

## 2019-08-29 DIAGNOSIS — L298 Other pruritus: Secondary | ICD-10-CM

## 2019-08-29 MED ORDER — HYDROXYZINE HCL 10 MG PO TABS: 10.0000 mg | ORAL_TABLET | Freq: Three times a day (TID) | ORAL | 1 refills | Status: DC | PRN

## 2019-08-29 MED ORDER — PREDNISONE 20 MG PO TABS: 20.0000 mg | ORAL_TABLET | Freq: Every day | ORAL | 0 refills | Status: AC

## 2019-08-29 MED FILL — predniSONE 20 MG TABS: 20 | 7 days supply | Qty: 7 | Fill #0

## 2019-08-29 MED FILL — hydrOXYzine HCL 10 MG TABS: 10 | 20 days supply | Qty: 60 | Fill #0

## 2019-08-29 NOTE — Progress Notes (Signed)
Virtual Visit via Telephone Note Due to national recommendations of social distancing due to [place] 19, telehealth visit is felt to be most appropriate for this patient at this time.  I discussed the limitations, risks, security and privacy concerns of performing an evaluation and management service by telephone and the availability of in person appointments. I also discussed with the patient that there may be a patient responsible charge related to this service. The patient expressed understanding and agreed to proceed.    I connected with Trevor Simmons on 08/29/19  at   1:50 PM EST  EDT by telephone and verified that I am speaking with the correct person using two identifiers.   Consent I discussed the limitations, risks, security and privacy concerns of performing an evaluation and management service by telephone and the availability of in person appointments. I also discussed with the patient that there may be a patient responsible charge related to this service. The patient expressed understanding and agreed to proceed.   Location of Patient: Private Residence   Location of Provider: [place] participating in Telemedicine visit: Geryl Rankins FNP-BC [place]    History of Present Illness: Telemedicine visit for: Establish Care No significant past medical history   Rash Patient complains of rash involving the abdomen, thighs, groin and buttocks. Rash started a few years ago.Discomfort associated with rash: is pruritic.  Associated symptoms: none. Denies: fever, nausea, vomiting and purulent drainage. Patient has had previous evaluation of rash and was prescribed a topical cream however can not recall the name of the cream. Response to treatment: good. Patient has not had contacts with similar rash. Patient has not identified precipitant. Patient has not had new exposures (soaps, lotions, laundry detergents, foods,  medications, plants, insects or animals.) He is currently using        History reviewed. No pertinent past medical history.  Past Surgical History:  Procedure Laterality Date  . APPENDECTOMY    . I & D EXTREMITY Right 08/19/2017   Procedure: IRRIGATION AND DEBRIDEMENT SMALL FINGER;  Surgeon: Leanora Cover, MD;  Location: [facility];  Service: Orthopedics;  Laterality: Right;  . WISDOM TOOTH EXTRACTION      Family History  Problem Relation Age of Onset  . Diabetes Neg Hx   . Hypertension Neg Hx     Social History   Socioeconomic History  . Marital status: Married    Spouse name: Not on file  . Number of children: Not on file  . Years of education: Not on file  . Highest education level: Not on file  Occupational History  . Not on file  Tobacco Use  . Smoking status: Never Smoker  . Smokeless tobacco: Never Used  Substance and Sexual Activity  . Alcohol use: No  . Drug use: No  . Sexual activity: Not Currently  Other Topics Concern  . Not on file  Social History Narrative  . Not on file   Social Determinants of Health   Financial Resource Strain:   . Difficulty of Paying Living Expenses: Not on file  Food Insecurity:   . Worried About Charity fundraiser in the Last Year: Not on file  . Ran Out of Food in the Last Year: Not on file  Transportation Needs:   . Lack of Transportation (Medical): Not on file  . Lack of Transportation (Non-Medical): Not on file  Physical Activity:   . Days of Exercise per Week: Not  on file  . Minutes of Exercise per Session: Not on file  Stress:   . Feeling of Stress : Not on file  Social Connections:   . Frequency of Communication with Friends and Family: Not on file  . Frequency of Social Gatherings with Friends and Family: Not on file  . Attends Religious Services: Not on file  . Active Member of Clubs or Organizations: Not on file  . Attends Archivist Meetings: Not on file  . Marital Status: Not on file      Observations/Objective: Awake, alert and oriented x 3   Review of Systems  Constitutional: Negative for fever, malaise/fatigue and weight loss.  HENT: Negative.  Negative for nosebleeds.   Eyes: Negative.  Negative for blurred vision, double vision and photophobia.  Respiratory: Negative.  Negative for cough and shortness of breath.   Cardiovascular: Negative.  Negative for chest pain, palpitations and leg swelling.  Gastrointestinal: Negative.  Negative for heartburn, nausea and vomiting.  Musculoskeletal: Negative.  Negative for myalgias.  Skin: Positive for itching and rash.  Neurological: Negative.  Negative for dizziness, focal weakness, seizures and headaches.  Psychiatric/Behavioral: Negative.  Negative for suicidal ideas.    Assessment and Plan: Trevor Simmons was seen today for new patient (initial visit).  Diagnoses and all orders for this visit:  Encounter to establish care  Lipid screening -     Lipid Panel  Screening for deficiency anemia -     CBC  Screening for metabolic disorder -     [id number]  Encounter for screening for diabetes mellitus -     A1c  Skin rash -     HIV antibody (with reflex) -     hydrOXYzine (ATARAX/VISTARIL) 10 MG tablet; Take 1 tablet (10 mg total) by mouth 3 (three) times daily as needed for itching. -     predniSONE (DELTASONE) 20 MG tablet; Take 1 tablet (20 mg total) by mouth daily with breakfast for 7 days.     Follow Up Instructions Return in about 2 weeks (around 09/12/2019) for SKIN RASH.     I discussed the assessment and treatment plan with the patient. The patient was provided an opportunity to ask questions and all were answered. The patient agreed with the plan and demonstrated an understanding of the instructions.   The patient was advised to call back or seek an in-person evaluation if the symptoms worsen or if the condition fails to improve as anticipated.  I provided 14 minutes of non-face-to-face time during this  encounter including median intraservice time, reviewing previous notes, labs, imaging, medications and explaining diagnosis and management.  Gildardo Pounds, FNP-BC

## 2019-08-30 LAB — CMP14+EGFR
ALT: 11 IU/L (ref 0–44)
AST: 13 IU/L (ref 0–40)
Albumin/Globulin Ratio: 2 (ref 1.2–2.2)
Albumin: 4.5 g/dL (ref 4.0–5.0)
Alkaline Phosphatase: 54 IU/L (ref 39–117)
BUN/Creatinine Ratio: 14 (ref 9–20)
BUN: 14 mg/dL (ref 6–20)
Bilirubin Total: 0.6 mg/dL (ref 0.0–1.2)
CO2: 27 mmol/L (ref 20–29)
Calcium: 9.3 mg/dL (ref 8.7–10.2)
Chloride: 101 mmol/L (ref 96–106)
Creatinine, Ser: 1 mg/dL (ref 0.76–1.27)
GFR calc Af Amer: 111 mL/min/{1.73_m2} (ref >59)
GFR calc non Af Amer: 96 mL/min/{1.73_m2} (ref >59)
Globulin, Total: 2.3 g/dL (ref 1.5–4.5)
Glucose: 48 mg/dL — ABNORMAL LOW (ref 65–99)
Potassium: 4.1 mmol/L (ref 3.5–5.2)
Sodium: 141 mmol/L (ref 134–144)
Total Protein: 6.8 g/dL (ref 6.0–8.5)

## 2019-08-30 LAB — CBC
Hematocrit: 45.5 % (ref 37.5–51.0)
Hemoglobin: 14.8 g/dL (ref 13.0–17.7)
MCH: 27.6 pg (ref 26.6–33.0)
MCHC: 32.5 g/dL (ref 31.5–35.7)
MCV: 85 fL (ref 79–97)
Platelets: 255 10*3/uL (ref 150–450)
RBC: 5.36 x10E6/uL (ref 4.14–5.80)
RDW: 13.1 % (ref 11.6–15.4)
WBC: 6.4 10*3/uL (ref 3.4–10.8)

## 2019-08-30 LAB — LIPID PANEL
Chol/HDL Ratio: 4.7 ratio (ref 0.0–5.0)
Cholesterol, Total: 206 mg/dL — ABNORMAL HIGH (ref 100–199)
HDL: 44 mg/dL (ref >39)
LDL Chol Calc (NIH): 131 mg/dL — ABNORMAL HIGH (ref 0–99)
Triglycerides: 172 mg/dL — ABNORMAL HIGH (ref 0–149)
VLDL Cholesterol Cal: 31 mg/dL (ref 5–40)

## 2019-08-30 LAB — HIV ANTIBODY (ROUTINE TESTING W REFLEX): HIV Screen 4th Generation wRfx: NONREACTIVE

## 2019-08-30 LAB — HEMOGLOBIN A1C
Est. average glucose Bld gHb Est-mCnc: 108 mg/dL
Hgb A1c MFr Bld: 5.4 % (ref 4.8–5.6)

## 2019-08-31 ENCOUNTER — Encounter: Payer: Self-pay | Admitting: Nurse Practitioner

## 2019-09-16 ENCOUNTER — Ambulatory Visit: Payer: Self-pay | Admitting: Nurse Practitioner

## 2019-09-19 ENCOUNTER — Ambulatory Visit: Payer: Self-pay

## 2019-11-12 ENCOUNTER — Encounter (HOSPITAL_COMMUNITY): Payer: Self-pay | Admitting: Emergency Medicine

## 2019-11-12 ENCOUNTER — Emergency Department (HOSPITAL_COMMUNITY): Payer: No Typology Code available for payment source

## 2019-11-12 ENCOUNTER — Emergency Department (HOSPITAL_COMMUNITY)
Admission: EM | Admit: 2019-11-12 | Discharge: 2019-11-12 | Disposition: A | Discharge status: Home / Self Care | Payer: No Typology Code available for payment source | Attending: Emergency Medicine | Admitting: Emergency Medicine

## 2019-11-12 DIAGNOSIS — M542 Cervicalgia: Secondary | ICD-10-CM | POA: Diagnosis present

## 2019-11-12 DIAGNOSIS — R519 Headache, unspecified: Secondary | ICD-10-CM | POA: Diagnosis not present

## 2019-11-12 DIAGNOSIS — Y92410 Unspecified street and highway as the place of occurrence of the external cause: Secondary | ICD-10-CM | POA: Insufficient documentation

## 2019-11-12 DIAGNOSIS — M546 Pain in thoracic spine: Secondary | ICD-10-CM | POA: Insufficient documentation

## 2019-11-12 DIAGNOSIS — Y999 Unspecified external cause status: Secondary | ICD-10-CM | POA: Insufficient documentation

## 2019-11-12 DIAGNOSIS — Y93I9 Activity, other involving external motion: Secondary | ICD-10-CM | POA: Diagnosis not present

## 2019-11-12 MED ORDER — CYCLOBENZAPRINE HCL 10 MG PO TABS: 10.0000 mg | ORAL_TABLET | Freq: Every day | ORAL | 0 refills | Status: AC

## 2019-11-12 MED ORDER — NAPROXEN 375 MG PO TABS: 375.0000 mg | ORAL_TABLET | Freq: Two times a day (BID) | ORAL | 0 refills | Status: AC

## 2019-11-12 NOTE — Discharge Instructions (Addendum)
You have been prescribed 2 medications.  The first medication is Flexeril which is a muscle relaxant.  This medication is sedating, so make sure to take it at night with dinner for help with your pain as well as to relieve your difficulty sleeping due to pain.  Do not operate a motor vehicle or heavy machinery after taking this medication.  You are also being prescribed naproxen which is a strong anti-inflammatory please take this twice a day as needed for management of your pain.  Please do not hesitate to return to the emergency department with any new or worsening symptoms.  Please make sure to follow-up with your primary care provider regarding this visit.

## 2019-11-12 NOTE — ED Notes (Signed)
Pt transported to CT ?
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
## 2019-11-12 NOTE — ED Notes (Signed)
Patient verbalizes understanding of discharge instructions. Opportunity for questioning and answers were provided. Armband removed by staff, pt discharged from ED ambulatory.   

## 2020-04-02 ENCOUNTER — Ambulatory Visit: Payer: Self-pay

## 2020-10-31 ENCOUNTER — Emergency Department (HOSPITAL_COMMUNITY): Payer: Self-pay

## 2020-10-31 ENCOUNTER — Encounter (HOSPITAL_COMMUNITY): Payer: Self-pay

## 2020-10-31 ENCOUNTER — Other Ambulatory Visit: Payer: Self-pay

## 2020-10-31 ENCOUNTER — Inpatient Hospital Stay (HOSPITAL_COMMUNITY)
Admission: EM | Admit: 2020-10-31 | Discharge: 2020-11-04 | DRG: 142 | Disposition: A | Discharge status: Home Health | Payer: Self-pay | Attending: Internal Medicine | Admitting: Internal Medicine

## 2020-10-31 DIAGNOSIS — R519 Headache, unspecified: Secondary | ICD-10-CM

## 2020-10-31 DIAGNOSIS — Z20822 Contact with and (suspected) exposure to covid-19: Secondary | ICD-10-CM | POA: Diagnosis present

## 2020-10-31 DIAGNOSIS — E785 Hyperlipidemia, unspecified: Secondary | ICD-10-CM | POA: Diagnosis present

## 2020-10-31 DIAGNOSIS — M272 Inflammatory conditions of jaws: Principal | ICD-10-CM | POA: Diagnosis present

## 2020-10-31 LAB — HIV ANTIBODY (ROUTINE TESTING W REFLEX): HIV Screen 4th Generation wRfx: NONREACTIVE

## 2020-10-31 LAB — CBC WITH DIFFERENTIAL/PLATELET
Abs Immature Granulocytes: 0.01 10*3/uL (ref 0.00–0.07)
Basophils Absolute: 0.1 10*3/uL (ref 0.0–0.1)
Basophils Relative: 1 %
Eosinophils Absolute: 0.4 10*3/uL (ref 0.0–0.5)
Eosinophils Relative: 5 %
HCT: 40.4 % (ref 39.0–52.0)
Hemoglobin: 13.9 g/dL (ref 13.0–17.0)
Immature Granulocytes: 0 %
Lymphocytes Relative: 23 %
Lymphs Abs: 2 10*3/uL (ref 0.7–4.0)
MCH: 28.9 pg (ref 26.0–34.0)
MCHC: 34.4 g/dL (ref 30.0–36.0)
MCV: 84 fL (ref 80.0–100.0)
Monocytes Absolute: 1 10*3/uL (ref 0.1–1.0)
Monocytes Relative: 11 %
Neutro Abs: 5.3 10*3/uL (ref 1.7–7.7)
Neutrophils Relative %: 60 %
Platelets: 240 10*3/uL (ref 150–400)
RBC: 4.81 MIL/uL (ref 4.22–5.81)
RDW: 12.8 % (ref 11.5–15.5)
WBC: 8.6 10*3/uL (ref 4.0–10.5)
nRBC: 0 % (ref 0.0–0.2)

## 2020-10-31 LAB — RESP PANEL BY RT-PCR (FLU A&B, COVID) ARPGX2
Influenza A by PCR: NEGATIVE
Influenza B by PCR: NEGATIVE
SARS Coronavirus 2 by RT PCR: NEGATIVE

## 2020-10-31 LAB — I-STAT CHEM 8, ED
BUN: 21 mg/dL — ABNORMAL HIGH (ref 6–20)
Calcium, Ion: 1.18 mmol/L (ref 1.15–1.40)
Chloride: 103 mmol/L (ref 98–111)
Creatinine, Ser: 1 mg/dL (ref 0.61–1.24)
Glucose, Bld: 101 mg/dL — ABNORMAL HIGH (ref 70–99)
HCT: 41 % (ref 39.0–52.0)
Hemoglobin: 13.9 g/dL (ref 13.0–17.0)
Potassium: 4.1 mmol/L (ref 3.5–5.1)
Sodium: 140 mmol/L (ref 135–145)
TCO2: 29 mmol/L (ref 22–32)

## 2020-10-31 LAB — COMPREHENSIVE METABOLIC PANEL
ALT: 13 U/L (ref 0–44)
AST: 14 U/L — ABNORMAL LOW (ref 15–41)
Albumin: 3.9 g/dL (ref 3.5–5.0)
Alkaline Phosphatase: 56 U/L (ref 38–126)
Anion gap: 8 (ref 5–15)
BUN: 15 mg/dL (ref 6–20)
CO2: 25 mmol/L (ref 22–32)
Calcium: 8.8 mg/dL — ABNORMAL LOW (ref 8.9–10.3)
Chloride: 103 mmol/L (ref 98–111)
Creatinine, Ser: 1.03 mg/dL (ref 0.61–1.24)
GFR, Estimated: >60 mL/min (ref >60)
Glucose, Bld: 108 mg/dL — ABNORMAL HIGH (ref 70–99)
Potassium: 3.7 mmol/L (ref 3.5–5.1)
Sodium: 136 mmol/L (ref 135–145)
Total Bilirubin: 1.2 mg/dL (ref 0.3–1.2)
Total Protein: 6.9 g/dL (ref 6.5–8.1)

## 2020-10-31 LAB — SEDIMENTATION RATE: Sed Rate: 9 mm/hr (ref 0–16)

## 2020-10-31 LAB — HEMOGLOBIN A1C
Hgb A1c MFr Bld: 5.5 % (ref 4.8–5.6)
Mean Plasma Glucose: 111.15 mg/dL

## 2020-10-31 MED ORDER — HEPARIN SODIUM (PORCINE) 5000 UNIT/ML IJ SOLN
5000.0000 [IU] | Freq: Three times a day (TID) | INTRAMUSCULAR | Status: DC
  Administered 2020-10-31 – 2020-11-03 (×6): 5000 [IU] via SUBCUTANEOUS
  Filled 2020-10-31 (×7): qty 1

## 2020-10-31 MED ORDER — ACETAMINOPHEN 650 MG RE SUPP: 650.0000 mg | Freq: Four times a day (QID) | RECTAL | Status: DC | PRN

## 2020-10-31 MED ORDER — VANCOMYCIN HCL 1500 MG/300ML IV SOLN
1500.0000 mg | Freq: Once | INTRAVENOUS | Status: AC
  Administered 2020-10-31: 1500 mg via INTRAVENOUS
  Filled 2020-10-31: qty 300

## 2020-10-31 MED ORDER — PIPERACILLIN-TAZOBACTAM 3.375 G IVPB 30 MIN
3.3750 g | Freq: Once | INTRAVENOUS | Status: AC
  Administered 2020-10-31: 3.375 g via INTRAVENOUS
  Filled 2020-10-31: qty 50

## 2020-10-31 MED ORDER — FENTANYL CITRATE (PF) 100 MCG/2ML IJ SOLN
50.0000 ug | Freq: Once | INTRAMUSCULAR | Status: AC
  Administered 2020-10-31: 50 ug via INTRAVENOUS
  Filled 2020-10-31: qty 2

## 2020-10-31 MED ORDER — GADOBUTROL 1 MMOL/ML IV SOLN
7.0000 mL | Freq: Once | INTRAVENOUS | Status: AC | PRN
  Administered 2020-10-31: 7 mL via INTRAVENOUS

## 2020-10-31 MED ORDER — VANCOMYCIN HCL 1000 MG/200ML IV SOLN
1000.0000 mg | Freq: Three times a day (TID) | INTRAVENOUS | Status: DC
  Administered 2020-10-31 – 2020-11-01 (×2): 1000 mg via INTRAVENOUS
  Filled 2020-10-31 (×4): qty 200

## 2020-10-31 MED ORDER — ACETAMINOPHEN 325 MG PO TABS: 650.0000 mg | ORAL_TABLET | Freq: Four times a day (QID) | ORAL | Status: DC | PRN

## 2020-10-31 MED ORDER — OXYCODONE HCL 5 MG PO TABS
5.0000 mg | ORAL_TABLET | Freq: Four times a day (QID) | ORAL | Status: DC | PRN
  Administered 2020-10-31 – 2020-11-01 (×3): 5 mg via ORAL
  Filled 2020-10-31 (×3): qty 1

## 2020-10-31 MED ORDER — IOHEXOL 300 MG/ML  SOLN
75.0000 mL | Freq: Once | INTRAMUSCULAR | Status: AC | PRN
  Administered 2020-10-31: 75 mL via INTRAVENOUS

## 2020-10-31 MED ORDER — POLYETHYLENE GLYCOL 3350 17 G PO PACK
17.0000 g | PACK | Freq: Every day | ORAL | Status: DC | PRN
  Administered 2020-11-04: 17 g via ORAL
  Filled 2020-10-31: qty 1

## 2020-10-31 NOTE — ED Triage Notes (Signed)
Patient with swelling and pain anterior to his L ear, states there feels like a hard ball, denies fever.

## 2020-10-31 NOTE — ED Notes (Signed)
Pt transported to MRI 

## 2020-10-31 NOTE — ED Notes (Signed)
Secretary to order lunch tray  

## 2020-10-31 NOTE — Consult Note (Signed)
Reason for Consult:Left preauricular pain and swelling Referring Physician: ER MD  Trevor Simmons is an 40 y.o. male.  CC: Pain and swelling in front of left ear for 5 days. Hurts to eat and swallow.   HPI: Pt underwent surgery approximately 17 years ago for cyst of the mandible and wisdom tooth removal. Pt states he had difficulty healing after the surgery for about a year. Had no further problems  Until 5 days ago when swelling and pain began.  History reviewed. No pertinent past medical history.  Past Surgical History:  Procedure Laterality Date  . APPENDECTOMY    . I & D EXTREMITY Right 08/19/2017   Procedure: IRRIGATION AND DEBRIDEMENT SMALL FINGER;  Surgeon: Betha Loa, MD;  Location: [facility] OR;  Service: Orthopedics;  Laterality: Right;  . WISDOM TOOTH EXTRACTION      Family History  Problem Relation Age of Onset  . Diabetes Neg Hx   . Hypertension Neg Hx     Social History:  reports that he has never smoked. He has never used smokeless tobacco. He reports that he does not drink alcohol and does not use drugs.  Allergies: No Known Allergies  Medications: I have reviewed the patient's current medications.  Results for orders placed or performed during the hospital encounter of 10/31/20 (from the past 48 hour(s))  I-stat chem 8, ED (not at [facility] or [facility])     Status: Abnormal   Collection Time: 10/31/20  5:09 AM  Result Value Ref Range   Sodium 140 135 - 145 mmol/L   Potassium 4.1 3.5 - 5.1 mmol/L   Chloride 103 98 - 111 mmol/L   BUN 21 (H) 6 - 20 mg/dL   Creatinine, Ser 7.67 0.61 - 1.24 mg/dL   Glucose, Bld 341 (H) 70 - 99 mg/dL    Comment: Glucose reference range applies only to samples taken after fasting for at least 8 hours.   Calcium, Ion 1.18 1.15 - 1.40 mmol/L   TCO2 29 22 - 32 mmol/L   Hemoglobin 13.9 13.0 - 17.0 g/dL   HCT 93.7 90.2 - 40.9 %  Sedimentation rate     Status: None   Collection Time: 10/31/20  6:18 AM  Result Value Ref Range   Sed Rate 9 0  - 16 mm/hr    Comment: Performed at [facility], [address], [place], [place] [address]  CBC with Differential     Status: None   Collection Time: 10/31/20  6:18 AM  Result Value Ref Range   WBC 8.6 4.0 - 10.5 K/uL   RBC 4.81 4.22 - 5.81 MIL/uL   Hemoglobin 13.9 13.0 - 17.0 g/dL   HCT 99.2 42.6 - 83.4 %   MCV 84.0 80.0 - 100.0 fL   MCH 28.9 26.0 - 34.0 pg   MCHC 34.4 30.0 - 36.0 g/dL   RDW 19.6 22.2 - 97.9 %   Platelets 240 150 - 400 K/uL   nRBC 0.0 0.0 - 0.2 %   Neutrophils Relative % 60 %   Neutro Abs 5.3 1.7 - 7.7 K/uL   Lymphocytes Relative 23 %   Lymphs Abs 2.0 0.7 - 4.0 K/uL   Monocytes Relative 11 %   Monocytes Absolute 1.0 0.1 - 1.0 K/uL   Eosinophils Relative 5 %   Eosinophils Absolute 0.4 0.0 - 0.5 K/uL   Basophils Relative 1 %   Basophils Absolute 0.1 0.0 - 0.1 K/uL   Immature Granulocytes 0 %   Abs Immature  Granulocytes 0.01 0.00 - 0.07 K/uL    Comment: Performed at [facility], [address] [place], [place] [address]    CT Maxillofacial W Contrast  Result Date: 10/31/2020 CLINICAL DATA:  40 year old male with facial pain. Pain and swelling anterior to the left ear. Denies fever. EXAM: CT MAXILLOFACIAL WITH CONTRAST TECHNIQUE: Multidetector CT imaging of the maxillofacial structures was performed with intravenous contrast. Multiplanar CT image reconstructions were also generated. CONTRAST:  70mL OMNIPAQUE IOHEXOL 300 MG/ML  SOLN COMPARISON:  None. FINDINGS: Osseous: Benign-appearing hypertrophic changes to the left lateral maxillary alveolar process at the level of the left maxillary molars on series 5, image 46. Superimposed mildly enlarged and sclerotic left mandible ramus (series 9, image 55, series 5 images 31 through 41) and there is a circumscribed rounded area of bone remodeling along the medial subcondylar region (series 5, image 51) where hypodense area with relatively simple fluid density is present and seems to communicate with the left  mandible wisdom tooth extraction site via an expanded bony canal (series 3, image 43). This seems to be separate separate from although in proximity to the course for the left inferior alveolar nerve. Left TMJ remains located and seems unaffected. No periosteal reaction. No left masticator space inflammation is evident. Contralateral right mandible wisdom tooth is still in place. No other significant dental finding identified. The remaining mandible appears normal. Bone mineralization elsewhere appears normal. Other facial bones intact. Visible central skull base and cervical vertebrae appear intact. Orbits: Intact orbital walls. Symmetric and negative orbits soft tissues. Sinuses: Clear. Soft tissues: Marked area of clinical concern is the left lateral face near the zygoma. No discrete soft tissue mass or abnormality in that region. Nearby left parotid space and left masticator spaces appear symmetric and within normal limits, although see left mandible abnormality detailed above. No Pena swelling. Negative visible larynx, pharynx, parapharyngeal spaces, retropharyngeal space, sublingual space, submandibular spaces. Major vascular structures in the visible neck and at the skull base appear patent and normal. No upper cervical lymphadenopathy. Limited intracranial: Negative. IMPRESSION: 1. No soft tissue space abnormality is identified, but deep to the marked area of clinical concern there is an abnormally expanded and sclerotic appearance of the left mandible ramus. Although nonspecific, I suspect this may be a post infectious sequelae such as chronic osteomyelitis related to extracted left mandible wisdom tooth - as a small 10 mm round cystic area which has smoothly remodeled the medial ramus appears to communicate with the wisdom tooth extraction site. A primary osteogenic tumor of the left mandible is also a consideration but felt less likely. A follow-up Face MRI without and with contrast would be helpful to  further characterize and exclude the possibility of active osteomyelitis. 2. The mandible elsewhere appears negative. Negative face CT otherwise. Electronically Signed   By: Odessa Fleming M.D.   On: 10/31/2020 05:50   MR FACE/TRIGEMINAL WO/W CM  Result Date: 10/31/2020 CLINICAL DATA:  40 year old male with facial pain, reports pain and swelling anterior to the left ear. Abnormal left mandible ramus on face CT with contrast earlier today. EXAM: MRI FACE TRIGEMINAL WITHOUT AND WITH CONTRAST TECHNIQUE: Multiplanar, multiecho pulse sequences of the face and surrounding structures, including thin slice imaging of the course of the Trigeminal Nerves, were obtained both before and after administration of intravenous contrast. CONTRAST:  41mL GADAVIST GADOBUTROL 1 MMOL/ML IV SOLN COMPARISON:  Face CT with contrast today. Also head CT 11/12/2019. FINDINGS: Visible pharynx, parapharyngeal spaces, retropharyngeal space, and all  the visible soft tissue spaces of the right face and neck are negative. There is only trace paranasal sinus mucosal thickening. Orbits are negative. Major intracranial vascular flow voids are preserved. Negative visible brain parenchyma. Furthermore, the left stylomastoid foramen is normal. Visible internal auditory structures appear normal. Mastoids are clear. Visible bone marrow signal is normal except in the left mandible beginning at the left mandible wisdom tooth extraction site. The left mandible ramus is abnormally sclerotic and enlarged (series 11, image 13) with a linear internal intermediate signal T1 and T2 channel tracking from the tooth extraction site to a mildly complex appearing oval 18 mm cyst which has chronically scalloped the medial subcondylar region. And in addition to the CT face findings earlier today, prior head CT from March of last year also demonstrates the abnormal subcondylar changes although the cyst and scalloping appear enlarged from that time (11/12/2019 series 4, image  1). Following contrast there is no enhancement of the sclerotic mandible. But both the linear abnormal intra-osseous tract, and the majority of the surrounding left muscles of mastication do abnormally enhance (see series 14, image 13). Abnormal enhancement of the intra osseous tract continues to the wisdom tooth extraction site as seen on series 13, image 1. There is associated moderate to severe increased T2 and STIR hyperintensity within the left masseter muscle (series 12, image 7). The cyst in the subcondylar region which remodels the bone demonstrates a thin rim of enhancement superiorly and medially (series 13, image 12). The nearby left TMJ seems largely unaffected with no joint fluid. IMPRESSION: 1. 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 Also a chronic but enlarged cystic lesion of the medial subcondylar mandible associated with that tract has complex fluid contents and partially rim enhances also. 2. 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 Sampling of the tooth extraction site might be the next best step. 3. The face elsewhere, the left middle ear, and the visible brain appear normal. Study discussed by telephone with PA Loleta Dicker in the ED at 0856 hours on 10/31/2020. Electronically Signed   By: Odessa Fleming M.D.   On: 10/31/2020 09:06    ROS Blood pressure 109/64, pulse (!) 58, temperature 97.9 F (36.6 C), resp. rate 16, height 5\' 10"  (1.778 m), weight 72.6 kg, SpO2 100 %. General appearance: alert, cooperative and no distress Head: Normocephalic, without obvious  abnormality, atraumatic Eyes: negative Nose: Nares normal. Septum midline. Mucosa normal. No drainage or sinus tenderness. Throat: Trismus to 1 cm. Unable to visualize pharynx, dentition.  Neck: no adenopathy and No tenderness.   Facial: Mild left preauricular edema with tenderness to touch. No fluctuance palpated.   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 Possible Brodie Abscess.   Plan: Admit per hospitalist. IV antibiotics. Will schedule for biopsy possible incision and drainage.  10/31/2020, 12:17 PM

## 2020-10-31 NOTE — H&P (Signed)
Date: 10/31/2020               Patient Name:  Trevor Simmons MRN: [id number]  DOB: 03/07/1981 Age / Sex: 40 y.o., male   PCP: Claiborne RiggFleming, Zelda W, NP         Medical Service: Internal Medicine Teaching Service         Attending Physician: Dr. Earl LagosNarendra, Nischal, MD    First Contact: Thalia BloodgoodVasili Steven Veazie, DO Pager: Theresa DutyVK [phone]  Second Contact: Dellia CloudBenjamin Coe, DO Pager: [facility] [phone]       After Hours (After 5p/  First Contact Pager: [phone]  weekends / holidays): Second Contact Pager: [phone]   SUBJECTIVE   Chief Complaint: Left-sided jaw pain and swelling  History of Present Illness:   Trevor Simmons is a 40 year old male with no pertinent past medical history presents to the ED with 6 days of left-sided facial pain and swelling is now having pain over his entire head.  He denies any trauma to the area.  He states the pain came on all of a sudden and gradually worsened.  Describes it as a hurting pain.  Worsened by moving his jaw up and down.  Improved with over-the-counter medication he does not remember the name of.  He states that 17 years ago he had a left lower wisdom tooth complication.  At that time he was offered invasive surgery versus I&D and antibiotic treatment. The patient decided on I&D/antibiotic treatment for year and a half.  He denies any complications since then. He states the pain today is similar to the pain then. He is able to tolerate his own secretions/liquids, but does have problems masticating and swallowing foods.  Denies fevers, chills, changes in vision, denies difficulty with hearing, chest pain, shortness of breath, cough, denies nausea, vomiting, diarrhea.  Denies difficulty urinating.  He has no other concerns at this time.  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    Meds:  Current Meds  Medication Sig  . acetaminophen (TYLENOL) 500 MG tablet Take 500 mg by mouth every 6 (six) hours as needed for moderate pain or headache.   Social:  Occupation: Surveyor, quantityainter and construction worker Level of Function: Fully able to perform ADLs PCP: Bertram DenverZelda Fleming, NP  Social History   Socioeconomic History  . Marital status: Married    Spouse name: Not on file  . Number of children: Not on file  . Years of education: Not on file  . Highest education level: Not on file  Occupational History  . Not on file  Tobacco Use  . Smoking status: Never Smoker  . Smokeless tobacco: Never Used  Vaping Use  . Vaping Use: Never used  Substance and Sexual Activity  . Alcohol use: No  . Drug use: No  . Sexual activity: Not Currently  Other Topics Concern  . Not on file  Social History Narrative  . Not on file   Social Determinants of Health   Financial Resource Strain: Not on file  Food Insecurity: Not on file  Transportation Needs: Not on file  Physical Activity: Not on file  Stress: Not on file  Social Connections: Not on file    Family History: Mother and father with history  of high blood pressure.  Denies cancer heart attacks or strokes.  Allergies: Allergies as of 10/31/2020  . (No Known Allergies)   History reviewed. No pertinent past medical history.  Review of Systems: A complete ROS was negative except as per HPI.   OBJECTIVE:   Physical Exam: Blood pressure 109/64, pulse (!) 58, temperature 97.9 F (36.6 C), resp. rate 16, height 5\' 10"  (1.778 m), weight 72.6 kg, SpO2 100 %. Constitutional: Uncomfortable appearing, no acute distress HENT: Normocephalic, atraumatic tender to palpate over the left preauricular area.  Mild edema to the area.  No erythema or warmth.  Patient with trismus,  difficult to fully assess pharynx.  No evidence of dental abscess or drainage.  No pharyngeal erythema appreciated. Eyes: conjunctiva non-erythematous Neck: supple Cardiovascular: regular rate and rhythm, no m/r/g Pulmonary/Chest: normal work of breathing on room air, lungs clear to auscultation bilaterally Abdominal: soft, non-tender, non-distended MSK: normal bulk and tone Neurological: alert & oriented x 3 Skin: warm and dry Psych: Normal mood   Labs: CBC    Component Value Date/Time   WBC 8.6 10/31/2020 0618   RBC 4.81 10/31/2020 0618   HGB 13.9 10/31/2020 0618   HGB 14.8 08/29/2019 1438   HCT 40.4 10/31/2020 0618   HCT 45.5 08/29/2019 1438   PLT 240 10/31/2020 0618   PLT 255 08/29/2019 1438   MCV 84.0 10/31/2020 0618   MCV 85 08/29/2019 1438   MCH 28.9 10/31/2020 0618   MCHC 34.4 10/31/2020 0618   RDW 12.8 10/31/2020 0618   RDW 13.1 08/29/2019 1438   LYMPHSABS 2.0 10/31/2020 0618   MONOABS 1.0 10/31/2020 0618   EOSABS 0.4 10/31/2020 0618   BASOSABS 0.1 10/31/2020 0618     CMP     Component Value Date/Time   NA 140 10/31/2020 0509   NA 141 08/29/2019 1438   K 4.1 10/31/2020 0509   CL 103 10/31/2020 0509   CO2 27 08/29/2019 1438   GLUCOSE 101 (H) 10/31/2020 0509   BUN 21 (H) 10/31/2020 0509   BUN 14 08/29/2019 1438   CREATININE 1.00 10/31/2020 0509   CALCIUM 9.3 08/29/2019 1438   PROT 6.8 08/29/2019 1438   ALBUMIN 4.5 08/29/2019 1438   AST 13 08/29/2019 1438   ALT 11 08/29/2019 1438   ALKPHOS 54 08/29/2019 1438   BILITOT 0.6 08/29/2019 1438   GFRNONAA 96 08/29/2019 1438   GFRAA 111 08/29/2019 1438    Imaging: CT Maxillofacial W Contrast  Result Date: 10/31/2020 CLINICAL DATA:  40 year old male with facial pain. Pain and swelling anterior to the left ear. Denies fever. EXAM: CT MAXILLOFACIAL WITH CONTRAST TECHNIQUE: Multidetector CT imaging of the maxillofacial structures was performed with intravenous contrast. Multiplanar CT image reconstructions were  also generated. CONTRAST:  69mL OMNIPAQUE IOHEXOL 300 MG/ML  SOLN COMPARISON:  None. FINDINGS: Osseous: Benign-appearing hypertrophic changes to the left lateral maxillary alveolar process at the level of the left maxillary molars on series 5, image 46. Superimposed mildly enlarged and sclerotic left mandible ramus (series 9, image 55, series 5 images 31 through 41) and there is a circumscribed rounded area of bone remodeling along the medial subcondylar region (series 5, image 51) where hypodense area with relatively simple fluid density is present and seems to communicate with the left mandible wisdom tooth extraction site via an expanded bony canal (series 3, image 43). This seems to be separate separate from although in proximity to the course for the left inferior alveolar nerve. Left TMJ remains located and seems  unaffected. No periosteal reaction. No left masticator space inflammation is evident. Contralateral right mandible wisdom tooth is still in place. No other significant dental finding identified. The remaining mandible appears normal. Bone mineralization elsewhere appears normal. Other facial bones intact. Visible central skull base and cervical vertebrae appear intact. Orbits: Intact orbital walls. Symmetric and negative orbits soft tissues. Sinuses: Clear. Soft tissues: Marked area of clinical concern is the left lateral face near the zygoma. No discrete soft tissue mass or abnormality in that region. Nearby left parotid space and left masticator spaces appear symmetric and within normal limits, although see left mandible abnormality detailed above. No Pena swelling. Negative visible larynx, pharynx, parapharyngeal spaces, retropharyngeal space, sublingual space, submandibular spaces. Major vascular structures in the visible neck and at the skull base appear patent and normal. No upper cervical lymphadenopathy. Limited intracranial: Negative. IMPRESSION: 1. No soft tissue space abnormality is  identified, but deep to the marked area of clinical concern there is an abnormally expanded and sclerotic appearance of the left mandible ramus. Although nonspecific, I suspect this may be a post infectious sequelae such as chronic osteomyelitis related to extracted left mandible wisdom tooth - as a small 10 mm round cystic area which has smoothly remodeled the medial ramus appears to communicate with the wisdom tooth extraction site. A primary osteogenic tumor of the left mandible is also a consideration but felt less likely. A follow-up Face MRI without and with contrast would be helpful to further characterize and exclude the possibility of active osteomyelitis. 2. The mandible elsewhere appears negative. Negative face CT otherwise. Electronically Signed   By: Odessa Fleming M.D.   On: 10/31/2020 05:50   MR FACE/TRIGEMINAL WO/W CM  Result Date: 10/31/2020 CLINICAL DATA:  40 year old male with facial pain, reports pain and swelling anterior to the left ear. Abnormal left mandible ramus on face CT with contrast earlier today. EXAM: MRI FACE TRIGEMINAL WITHOUT AND WITH CONTRAST TECHNIQUE: Multiplanar, multiecho pulse sequences of the face and surrounding structures, including thin slice imaging of the course of the Trigeminal Nerves, were obtained both before and after administration of intravenous contrast. CONTRAST:  86mL GADAVIST GADOBUTROL 1 MMOL/ML IV SOLN COMPARISON:  Face CT with contrast today. Also head CT 11/12/2019. FINDINGS: Visible pharynx, parapharyngeal spaces, retropharyngeal space, and all the visible soft tissue spaces of the right face and neck are negative. There is only trace paranasal sinus mucosal thickening. Orbits are negative. Major intracranial vascular flow voids are preserved. Negative visible brain parenchyma. Furthermore, the left stylomastoid foramen is normal. Visible internal auditory structures appear normal. Mastoids are clear. Visible bone marrow signal is normal except in the left  mandible beginning at the left mandible wisdom tooth extraction site. The left mandible ramus is abnormally sclerotic and enlarged (series 11, image 13) with a linear internal intermediate signal T1 and T2 channel tracking from the tooth extraction site to a mildly complex appearing oval 18 mm cyst which has chronically scalloped the medial subcondylar region. And in addition to the CT face findings earlier today, prior head CT from March of last year also demonstrates the abnormal subcondylar changes although the cyst and scalloping appear enlarged from that time (11/12/2019 series 4, image 1). Following contrast there is no enhancement of the sclerotic mandible. But both the linear abnormal intra-osseous tract, and the majority of the surrounding left muscles of mastication do abnormally enhance (see series 14, image 13). Abnormal enhancement of the intra osseous tract continues to the wisdom tooth extraction site as seen  on series 13, image 1. There is associated moderate to severe increased T2 and STIR hyperintensity within the left masseter muscle (series 12, image 7). The cyst in the subcondylar region which remodels the bone demonstrates a thin rim of enhancement superiorly and medially (series 13, image 12). The nearby left TMJ seems largely unaffected with no joint fluid. IMPRESSION: 1. 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 Also a chronic but enlarged cystic lesion of the medial subcondylar mandible associated with that tract has complex fluid contents and partially rim enhances also. 2. This constellation of findings is most compatible with acute on chronic Infection, and favor a Brodie Abscess - type  disease mechanism within the left mandible. Sampling of the tooth extraction site might be the next best step. 3. The face elsewhere, the left middle ear, and the visible brain appear normal. Study discussed by telephone with PA Loleta Dicker in the ED at 0856 hours on 10/31/2020. Electronically Signed   By: Odessa Fleming M.D.   On: 10/31/2020 09:06    EKG: Sinus bradycardia, borderline right axis deviation.  Q waves in lead II.  J-point elevation on precordial leads.  No prior for comparison   ASSESSMENT & PLAN:    Assessment & Plan by Problem: Active Problems:   Osteomyelitis of jaw  Velton Roselle is a 40 y.o. 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  Per imaging appears to be acute on chronic osteomyelitis.  Oral surgery following, will prepare for biopsy as well as possible I&D tomorrow. Started on vancomycin and given 1 dose of Zosyn while in the ED.  Will transition over to Vanc and cefepime -Vancomycin and cefepime day 0.  Will narrow, pending culture -Biopsy and possible I&D tomorrow per oral surgery, appreciate their assistance -Pain control with Tylenol, plan to add narcotic once creatinine results.  Patient is opioid naive -We will not order blood cultures, patient already given doses of antibiotics -Stool softener as needed -Soft diet, n.p.o. at midnight  Hyperlipidemia Patient with lipid panel by PCP positive for hyperlipidemia.  Uncertain if he started on a statin, he denies being on any medications.  Will rediscussed with patient and if not, will start statin therapy.  Diet: Soft diet, n.p.o. at midnight VTE: Heparin IVF: None,None Code: Full  Prior to Admission Living  Arrangement: Home. Anticipated Discharge Location: Home Barriers to Discharge: IV antibiotics, further work-up of osteomyelitis  Dispo: Admit patient to Inpatient with expected length of stay greater than 2 midnights.  Signed: Belva Agee, MD Internal Medicine Resident PGY-1 Pager: [phone]  10/31/2020, 1:12 PM

## 2020-10-31 NOTE — ED Provider Notes (Signed)
Physical Exam  BP 112/81 (BP Location: Left Arm)   Pulse (!) 58   Temp 97.9 F (36.6 C)   Resp 20   Wt 72.6 kg   SpO2 99%   Physical Exam Vitals and nursing note reviewed.  HENT:     Head: Normocephalic and atraumatic.     Jaw: Trismus, tenderness and swelling present.      Comments: Trismus secondary to pain.  No submental or sublingual TTP, oropharyngeal exam limited due to trismus.     Nose: Nose normal.     Mouth/Throat:     Mouth: Mucous membranes are moist.  Eyes:     General:        Right eye: No discharge.        Left eye: No discharge.     Extraocular Movements: Extraocular movements intact.     Conjunctiva/sclera: Conjunctivae normal.     Pupils: Pupils are equal, round, and reactive to light.  Neck:     Thyroid: No thyroid mass.     Trachea: Trachea and phonation normal.  Cardiovascular:     Rate and Rhythm: Normal rate and regular rhythm.     Pulses: Normal pulses.     Heart sounds: Normal heart sounds. No murmur heard.   Pulmonary:     Effort: Pulmonary effort is normal. No respiratory distress.     Breath sounds: Normal breath sounds. No wheezing or rales.  Abdominal:     General: Bowel sounds are normal. There is no distension.     Palpations: Abdomen is soft.     Tenderness: There is no abdominal tenderness. There is no guarding or rebound.  Musculoskeletal:        General: No deformity.     Cervical back: Normal range of motion and neck supple. No rigidity, tenderness or crepitus. No pain with movement, spinous process tenderness or muscular tenderness.     Right lower leg: No edema.     Left lower leg: No edema.  Lymphadenopathy:     Cervical: No cervical adenopathy.  Skin:    General: Skin is warm and dry.     Capillary Refill: Capillary refill takes less than 2 seconds.  Neurological:     General: No focal deficit present.     Mental Status: He is alert and oriented to person, place, and time. Mental status is at baseline.  Psychiatric:         Mood and Affect: Mood normal.     ED Course/Procedures   Clinical Course as of 10/31/20 1155  Sun Oct 31, 2020  0446 Preauricular TTP and swelling, but firm to touch. No extension to the neck to suggest parotid source. No dental TTP or gingival swelling. Not nodular on exam to suggest lymphadenopathy. No overlying infectious changes. Will obtain CT for further characterization. [KH]  0829 RDW: 12.8 [RS]  0902 Radiologist, Dr. Margo Aye, called radiology report to this provider. He feels imaging is most consistent with Brodie's abscess of the left mandible.  [RS]  1013 Consult call received back from oral surgeon, Dr. Barbette Merino, who reviewed the patient's imaging and has concern for possible pathologic fracture of the mandible as well as previous identified pyogenic osteomyelitis of the mandible.  He states patient will need to undergo biopsy for further evaluation, and is requesting medical admission to the hospital with oral surgery consultation.  I appreciate his collaboration care of this patient. [RS]  1147 Consult to teaching internal medicine teaching service, who are agreeable to seeing this  patient admitting him to their service.  Appreciate their collaboration in the care of this patient. [RS]    Clinical Course User Index [KH] Antony Madura, PA-C [RS] Santa Lighter, Eugene Gavia, PA-C    Procedures  MDM  Care of this patient was assumed from preceding ED provider, Antony Madura, PA-C at time of shift change.  Please see her ED note for further insight into this patient's initial work-up.  In brief, patient has had 5 days of left-sided facial swelling and pain.  Laboratory studies were reassuring, however CT scan was concerning for possible acute on chronic osteomyelitis of the left mandible.  At time of my arrival, patient is awaiting MRI of the mandible for further evaluation.  CBC was unremarkable, i-STAT Chem-8 unremarkable, sed rate is normal, 9.  MRI revealed chronic abnormal sclerosis  of the left mandibular ramus with superimposed acute inflammation with associated musculature as well as soft tissue tract within the bone communicating to left wisdom tooth extraction site.  Additionally there is a chronic enlarged cyst at the medial subcondylar mandible associated with a sinus tract concerning for collection of complex fluid.  Radiologist feels patient's presentation is most consistent with a Brodie's abscess, which is a pyogenic osteomyelitis.   I discussed imaging findings with the patient and obtained further insight into his medical history.  Discussions with the patient at time of my assumption of his care were conducted using the assistance of Spanish interpreters # [phone] and [phone].  The patient states that approximately 17 years ago he had a cyst on his mandible on the left side, which was removed by an oral surgeon at the same time of his wisdom tooth extraction on the same side.  He states that he had extensive surgery and underwent a year and a half of wound care to help the mandible heal.  He states he has not had any problems with his jaw since that time, until approximately 5 days ago when he began to experience left-sided facial swelling and pain.  Prior to that he was feeling normal and well.  Case discussed with oral surgeon per above.  Please see ED course for further detail.  Patient will require admission to the hospital for apparent acute on chronic osteomyelitis with possible bone destruction.  Oral surgery to consult after hospital admission for biopsy and definitive treatment.  Patient has received IV antibiotics with Zosyn and vancomycin in the emergency department.  Analgesia ordered as well.  Disposition plan discussed with patient and his wife at the bedside using Spanish interpreter.  They voiced understanding of his medical evaluation and treatment plan.  Each of their questions was answered to their expressed satisfaction.  They are amenable to plan for  admission to the hospital at this time.  COVID-19 test pending.  This chart was dictated using voice recognition software, Dragon. Despite the best efforts of this provider to proofread and correct errors, errors may still occur which can change documentation meaning.     Paris Lore, PA-C 10/31/20 1205    Margarita Grizzle, MD 11/02/20 [phone]

## 2020-10-31 NOTE — Progress Notes (Signed)
Pharmacy Antibiotic Note  Trevor Simmons is a 40 y.o. male presented to the ED 10/31/2020 with L-sided facial pain and swelling in area anterior to left ear x5 days with CT concerning for sclerotic appearance and concern of osteomyelitis.  Pharmacy has been consulted for Vancomycin dosing. Patient going for MRI. No antibiotics prior to this visit.   Zosyn 3.375g IV x1 given in ED.   Plan: Vancomycin 1500 mg IV x1, then 1000mg  IV every 8 hours.  Monitor renal function, any culture results, and clinical status.  Follow-up results of MRI.  Follow-up if further gram negative/anaerobic coverage needed.   Weight: 72.6 kg (160 lb)  Temp (24hrs), Avg:97.9 F (36.6 C), Min:97.9 F (36.6 C), Max:97.9 F (36.6 C)  Recent Labs  Lab 10/31/20 0509 10/31/20 0618  WBC  --  8.6  CREATININE 1.00  --     CrCl cannot be calculated (Unknown ideal weight.).    No Known Allergies  Antimicrobials this admission: Zosyn 3/20 x1  Vancomycin 3/20 >>  Dose adjustments this admission:   Microbiology results: none  Thank you for allowing pharmacy to be a part of this patient's care.  4/20, PharmD, BCPS, BCCCP Clinical Pharmacist Please refer to [facility] for [facility] numbers 10/31/2020 9:24 AM

## 2020-10-31 NOTE — Plan of Care (Signed)
  Problem: Education: Goal: Knowledge of General Education information will improve Description: Including pain rating scale, medication(s)/side effects and non-pharmacologic comfort measures Outcome: Progressing   Problem: Clinical Measurements: Goal: Ability to maintain clinical measurements within normal limits will improve Outcome: Progressing Goal: Cardiovascular complication will be avoided Outcome: Progressing   Problem: Nutrition: Goal: Adequate nutrition will be maintained Outcome: Progressing   Problem: Coping: Goal: Level of anxiety will decrease Outcome: Progressing

## 2020-10-31 NOTE — ED Provider Notes (Signed)
[facility] EMERGENCY DEPARTMENT Provider Note   CSN: [id number] Arrival date & time: 10/31/20  0041     History Chief Complaint  Patient presents with  . Facial Pain    Trevor Simmons is a 40 y.o. male.  40 year old male presents to the emergency department for evaluation of left-sided facial pain and swelling.  Reports swelling anterior to his left ear which has been present x5 days and worsening.  Has an associated throbbing pain which radiates throughout his head.  This is improved temporarily with naproxen.  Describes inflamed site as a "hard ball".  Denies any trauma, injury or associated fevers, inability to swallow, drooling, shortness of breath, ear drainage, neck stiffness.  The history is provided by the patient. No language interpreter was used.       History reviewed. No pertinent past medical history.  There are no problems to display for this patient.   Past Surgical History:  Procedure Laterality Date  . APPENDECTOMY    . I & D EXTREMITY Right 08/19/2017   Procedure: IRRIGATION AND DEBRIDEMENT SMALL FINGER;  Surgeon: Betha Loa, MD;  Location: [facility] OR;  Service: Orthopedics;  Laterality: Right;  . WISDOM TOOTH EXTRACTION         Family History  Problem Relation Age of Onset  . Diabetes Neg Hx   . Hypertension Neg Hx     Social History   Tobacco Use  . Smoking status: Never Smoker  . Smokeless tobacco: Never Used  Vaping Use  . Vaping Use: Never used  Substance Use Topics  . Alcohol use: No  . Drug use: No    Home Medications Prior to Admission medications   Medication Sig Start Date End Date Taking? Authorizing Provider  acetaminophen (TYLENOL) 500 MG tablet Take 500 mg by mouth every 6 (six) hours as needed for moderate pain or headache.   Yes [provider]    Allergies    Patient has no known allergies.  Review of Systems   Review of Systems  Ten systems reviewed and are negative for acute change,  except as noted in the HPI.    Physical Exam Updated Vital Signs BP 112/81 (BP Location: Left Arm)   Pulse (!) 58   Temp 97.9 F (36.6 C)   Resp 20   Wt 72.6 kg   SpO2 99%   Physical Exam Vitals and nursing note reviewed.  Constitutional:      General: He is not in acute distress.    Appearance: He is well-developed. He is not diaphoretic.     Comments: Nontoxic appearing and in NAD  HENT:     Head: Normocephalic.     Comments: Pain and swelling to the preauricular region on the left side. Firm to touch and tender without erythema. No changes to the outer ear.    Right Ear: External ear normal.     Left Ear: External ear normal.     Mouth/Throat:     Mouth: Mucous membranes are moist.     Comments: Trismus 2/2 pain Eyes:     General: No scleral icterus.    Conjunctiva/sclera: Conjunctivae normal.  Neck:     Comments: No meningismus  Pulmonary:     Effort: Pulmonary effort is normal. No respiratory distress.     Comments: Respirations even and unlabored Musculoskeletal:        General: Normal range of motion.     Cervical back: Normal range of motion.  Skin:  General: Skin is warm and dry.     Coloration: Skin is not pale.     Findings: No erythema or rash.  Neurological:     Mental Status: He is alert and oriented to person, place, and time.     Coordination: Coordination normal.  Psychiatric:        Behavior: Behavior normal.     ED Results / Procedures / Treatments   Labs (all labs ordered are listed, but only abnormal results are displayed) Labs Reviewed  I-STAT CHEM 8, ED - Abnormal; Notable for the following components:      Result Value   BUN 21 (*)    Glucose, Bld 101 (*)    All other components within normal limits  SEDIMENTATION RATE  CBC WITH DIFFERENTIAL/PLATELET    EKG None  Radiology CT Maxillofacial W Contrast  Result Date: 10/31/2020 CLINICAL DATA:  40 year old male with facial pain. Pain and swelling anterior to the left ear.  Denies fever. EXAM: CT MAXILLOFACIAL WITH CONTRAST TECHNIQUE: Multidetector CT imaging of the maxillofacial structures was performed with intravenous contrast. Multiplanar CT image reconstructions were also generated. CONTRAST:  53mL OMNIPAQUE IOHEXOL 300 MG/ML  SOLN COMPARISON:  None. FINDINGS: Osseous: Benign-appearing hypertrophic changes to the left lateral maxillary alveolar process at the level of the left maxillary molars on series 5, image 46. Superimposed mildly enlarged and sclerotic left mandible ramus (series 9, image 55, series 5 images 31 through 41) and there is a circumscribed rounded area of bone remodeling along the medial subcondylar region (series 5, image 51) where hypodense area with relatively simple fluid density is present and seems to communicate with the left mandible wisdom tooth extraction site via an expanded bony canal (series 3, image 43). This seems to be separate separate from although in proximity to the course for the left inferior alveolar nerve. Left TMJ remains located and seems unaffected. No periosteal reaction. No left masticator space inflammation is evident. Contralateral right mandible wisdom tooth is still in place. No other significant dental finding identified. The remaining mandible appears normal. Bone mineralization elsewhere appears normal. Other facial bones intact. Visible central skull base and cervical vertebrae appear intact. Orbits: Intact orbital walls. Symmetric and negative orbits soft tissues. Sinuses: Clear. Soft tissues: Marked area of clinical concern is the left lateral face near the zygoma. No discrete soft tissue mass or abnormality in that region. Nearby left parotid space and left masticator spaces appear symmetric and within normal limits, although see left mandible abnormality detailed above. No Pena swelling. Negative visible larynx, pharynx, parapharyngeal spaces, retropharyngeal space, sublingual space, submandibular spaces. Major vascular  structures in the visible neck and at the skull base appear patent and normal. No upper cervical lymphadenopathy. Limited intracranial: Negative. IMPRESSION: 1. No soft tissue space abnormality is identified, but deep to the marked area of clinical concern there is an abnormally expanded and sclerotic appearance of the left mandible ramus. Although nonspecific, I suspect this may be a post infectious sequelae such as chronic osteomyelitis related to extracted left mandible wisdom tooth - as a small 10 mm round cystic area which has smoothly remodeled the medial ramus appears to communicate with the wisdom tooth extraction site. A primary osteogenic tumor of the left mandible is also a consideration but felt less likely. A follow-up Face MRI without and with contrast would be helpful to further characterize and exclude the possibility of active osteomyelitis. 2. The mandible elsewhere appears negative. Negative face CT otherwise. Electronically Signed   By: Rexene Edison  Margo Aye M.D.   On: 10/31/2020 05:50    Procedures Procedures   Medications Ordered in ED Medications  iohexol (OMNIPAQUE) 300 MG/ML solution 75 mL (75 mLs Intravenous Contrast Given 10/31/20 0521)    ED Course  I have reviewed the triage vital signs and the nursing notes.  Pertinent labs & imaging results that were available during my care of the patient were reviewed by me and considered in my medical decision making (see chart for details).  Clinical Course as of 10/31/20 0650  Sun Oct 31, 2020  0446 Preauricular TTP and swelling, but firm to touch. No extension to the neck to suggest parotid source. No dental TTP or gingival swelling. Not nodular on exam to suggest lymphadenopathy. No overlying infectious changes. Will obtain CT for further characterization. [KH]    Clinical Course User Index [KH] Darylene Price   MDM Rules/Calculators/A&P                          40 year old male presents to the emergency department for evaluation  of 5 days of left-sided facial pain.  There is some asymmetry to the face at the site of discomfort in comparison to the right.  CT obtained which raises clinical concern for sclerotic appearance of the left mandible ramus.  Differential includes acute osteomyelitis versus chronic osteomyelitis versus osteogenic tumor.  MRI ordered for further characterization.  Patient updated.  Care signed out to [facility], PA-C at shift change pending imaging results.   Final Clinical Impression(s) / ED Diagnoses Final diagnoses:  Facial pain    Rx / DC Orders ED Discharge Orders    None       Antony Madura, PA-C 10/31/20 2458    Marily Memos, MD 11/10/20 [phone]

## 2020-11-01 ENCOUNTER — Encounter (HOSPITAL_COMMUNITY)
Admission: EM | Disposition: A | Discharge status: Home Health | Payer: Self-pay | Source: Home / Self Care | Attending: Internal Medicine

## 2020-11-01 ENCOUNTER — Encounter (HOSPITAL_COMMUNITY): Payer: Self-pay | Admitting: Internal Medicine

## 2020-11-01 ENCOUNTER — Inpatient Hospital Stay (HOSPITAL_COMMUNITY): Payer: Self-pay | Admitting: Anesthesiology

## 2020-11-01 HISTORY — PX: INCISION AND DRAINAGE ABSCESS: SHX5864

## 2020-11-01 LAB — BASIC METABOLIC PANEL
Anion gap: 9 (ref 5–15)
BUN: 12 mg/dL (ref 6–20)
CO2: 24 mmol/L (ref 22–32)
Calcium: 9 mg/dL (ref 8.9–10.3)
Chloride: 103 mmol/L (ref 98–111)
Creatinine, Ser: 1.04 mg/dL (ref 0.61–1.24)
GFR, Estimated: >60 mL/min (ref >60)
Glucose, Bld: 105 mg/dL — ABNORMAL HIGH (ref 70–99)
Potassium: 3.8 mmol/L (ref 3.5–5.1)
Sodium: 136 mmol/L (ref 135–145)

## 2020-11-01 LAB — CBC
HCT: 40.2 % (ref 39.0–52.0)
Hemoglobin: 13.9 g/dL (ref 13.0–17.0)
MCH: 28.4 pg (ref 26.0–34.0)
MCHC: 34.6 g/dL (ref 30.0–36.0)
MCV: 82.2 fL (ref 80.0–100.0)
Platelets: 231 10*3/uL (ref 150–400)
RBC: 4.89 MIL/uL (ref 4.22–5.81)
RDW: 12.2 % (ref 11.5–15.5)
WBC: 9.1 10*3/uL (ref 4.0–10.5)
nRBC: 0 % (ref 0.0–0.2)

## 2020-11-01 LAB — SURGICAL PCR SCREEN
MRSA, PCR: NEGATIVE
Staphylococcus aureus: NEGATIVE

## 2020-11-01 SURGERY — INCISION AND DRAINAGE, ABSCESS
Anesthesia: General | Laterality: Left

## 2020-11-01 MED ORDER — OXYMETAZOLINE HCL 0.05 % NA SOLN
NASAL | Status: AC
  Filled 2020-11-01: qty 30

## 2020-11-01 MED ORDER — FENTANYL CITRATE (PF) 250 MCG/5ML IJ SOLN
INTRAMUSCULAR | Status: AC
  Filled 2020-11-01: qty 5

## 2020-11-01 MED ORDER — ROCURONIUM BROMIDE 10 MG/ML (PF) SYRINGE
PREFILLED_SYRINGE | INTRAVENOUS | Status: AC
  Filled 2020-11-01: qty 10

## 2020-11-01 MED ORDER — LIDOCAINE-EPINEPHRINE 1 %-1:100000 IJ SOLN
INTRAMUSCULAR | Status: DC | PRN
  Administered 2020-11-01: 20 mL

## 2020-11-01 MED ORDER — SODIUM CHLORIDE 0.9 % IV SOLN
2.0000 g | Freq: Three times a day (TID) | INTRAVENOUS | Status: DC
  Filled 2020-11-01: qty 2

## 2020-11-01 MED ORDER — PROPOFOL 10 MG/ML IV BOLUS
INTRAVENOUS | Status: DC | PRN
  Administered 2020-11-01: 50 mg via INTRAVENOUS
  Administered 2020-11-01: 100 mg via INTRAVENOUS

## 2020-11-01 MED ORDER — DEXAMETHASONE SODIUM PHOSPHATE 10 MG/ML IJ SOLN
INTRAMUSCULAR | Status: AC
  Filled 2020-11-01: qty 1

## 2020-11-01 MED ORDER — SODIUM CHLORIDE 0.9 % IR SOLN
Status: DC | PRN
  Administered 2020-11-01: 1000 mL

## 2020-11-01 MED ORDER — SODIUM CHLORIDE 0.9 % IV SOLN
2.0000 g | Freq: Once | INTRAVENOUS | Status: DC
  Filled 2020-11-01: qty 2

## 2020-11-01 MED ORDER — ONDANSETRON HCL 4 MG/2ML IJ SOLN
INTRAMUSCULAR | Status: AC
  Filled 2020-11-01: qty 2

## 2020-11-01 MED ORDER — OXYMETAZOLINE HCL 0.05 % NA SOLN
NASAL | Status: DC | PRN
  Administered 2020-11-01: 3 via NASAL

## 2020-11-01 MED ORDER — CHLORHEXIDINE GLUCONATE 0.12 % MT SOLN
OROMUCOSAL | Status: AC
  Filled 2020-11-01: qty 15

## 2020-11-01 MED ORDER — MORPHINE SULFATE (PF) 2 MG/ML IV SOLN
1.0000 mg | INTRAVENOUS | Status: DC | PRN
  Administered 2020-11-01 – 2020-11-02 (×3): 1 mg via INTRAVENOUS
  Filled 2020-11-01 (×5): qty 1

## 2020-11-01 MED ORDER — ONDANSETRON HCL 4 MG/2ML IJ SOLN
INTRAMUSCULAR | Status: DC | PRN
  Administered 2020-11-01: 4 mg via INTRAVENOUS

## 2020-11-01 MED ORDER — FENTANYL CITRATE (PF) 100 MCG/2ML IJ SOLN: 25.0000 ug | INTRAMUSCULAR | Status: DC | PRN

## 2020-11-01 MED ORDER — SODIUM CHLORIDE 0.9 % IV SOLN
3.0000 g | Freq: Four times a day (QID) | INTRAVENOUS | Status: DC
  Administered 2020-11-01 – 2020-11-04 (×13): 3 g via INTRAVENOUS
  Filled 2020-11-01 (×2): qty 3
  Filled 2020-11-01: qty 8
  Filled 2020-11-01: qty 3
  Filled 2020-11-01: qty 8
  Filled 2020-11-01 (×2): qty 3
  Filled 2020-11-01 (×3): qty 8
  Filled 2020-11-01 (×2): qty 3
  Filled 2020-11-01 (×3): qty 8
  Filled 2020-11-01 (×2): qty 3

## 2020-11-01 MED ORDER — LIDOCAINE 2% (20 MG/ML) 5 ML SYRINGE
INTRAMUSCULAR | Status: AC
  Filled 2020-11-01: qty 5

## 2020-11-01 MED ORDER — LIDOCAINE HCL (CARDIAC) PF 100 MG/5ML IV SOSY
PREFILLED_SYRINGE | INTRAVENOUS | Status: DC | PRN
  Administered 2020-11-01: 60 mg via INTRAVENOUS

## 2020-11-01 MED ORDER — MIDAZOLAM HCL 5 MG/5ML IJ SOLN
INTRAMUSCULAR | Status: DC | PRN
  Administered 2020-11-01: 2 mg via INTRAVENOUS

## 2020-11-01 MED ORDER — LIDOCAINE-EPINEPHRINE 1 %-1:100000 IJ SOLN
INTRAMUSCULAR | Status: AC
  Filled 2020-11-01: qty 1

## 2020-11-01 MED ORDER — WHITE PETROLATUM EX OINT
TOPICAL_OINTMENT | CUTANEOUS | Status: DC | PRN
  Filled 2020-11-01: qty 28.35

## 2020-11-01 MED ORDER — CHLORHEXIDINE GLUCONATE 0.12 % MT SOLN: 15.0000 mL | Freq: Once | OROMUCOSAL | Status: DC

## 2020-11-01 MED ORDER — SUCCINYLCHOLINE CHLORIDE 200 MG/10ML IV SOSY
PREFILLED_SYRINGE | INTRAVENOUS | Status: AC
  Filled 2020-11-01: qty 10

## 2020-11-01 MED ORDER — ROCURONIUM BROMIDE 100 MG/10ML IV SOLN
INTRAVENOUS | Status: DC | PRN
  Administered 2020-11-01: 40 mg via INTRAVENOUS

## 2020-11-01 MED ORDER — MIDAZOLAM HCL 2 MG/2ML IJ SOLN
INTRAMUSCULAR | Status: AC
  Filled 2020-11-01: qty 2

## 2020-11-01 MED ORDER — DEXTROSE-NACL 5-0.45 % IV SOLN: INTRAVENOUS | Status: DC

## 2020-11-01 MED ORDER — DEXAMETHASONE SODIUM PHOSPHATE 4 MG/ML IJ SOLN
INTRAMUSCULAR | Status: DC | PRN
  Administered 2020-11-01: 4 mg via INTRAVENOUS

## 2020-11-01 MED ORDER — MORPHINE SULFATE (PF) 2 MG/ML IV SOLN: 1.0000 mg | INTRAVENOUS | Status: DC | PRN

## 2020-11-01 MED ORDER — SUGAMMADEX SODIUM 200 MG/2ML IV SOLN
INTRAVENOUS | Status: DC | PRN
  Administered 2020-11-01: 150 mg via INTRAVENOUS

## 2020-11-01 MED ORDER — OXYCODONE-ACETAMINOPHEN 5-325 MG PO TABS
1.0000 | ORAL_TABLET | ORAL | Status: DC | PRN
  Administered 2020-11-01: 1 via ORAL
  Administered 2020-11-02 – 2020-11-03 (×3): 2 via ORAL
  Administered 2020-11-03: 1 via ORAL
  Administered 2020-11-03: 2 via ORAL
  Administered 2020-11-03: 1 via ORAL
  Administered 2020-11-03 – 2020-11-04 (×3): 2 via ORAL
  Filled 2020-11-01 (×2): qty 1
  Filled 2020-11-01 (×9): qty 2

## 2020-11-01 MED ORDER — LACTATED RINGERS IV SOLN: INTRAVENOUS | Status: DC

## 2020-11-01 MED ORDER — ORAL CARE MOUTH RINSE: 15.0000 mL | Freq: Once | OROMUCOSAL | Status: DC

## 2020-11-01 MED ORDER — VANCOMYCIN HCL 750 MG/150ML IV SOLN
750.0000 mg | Freq: Three times a day (TID) | INTRAVENOUS | Status: DC
  Administered 2020-11-01 – 2020-11-02 (×3): 750 mg via INTRAVENOUS
  Filled 2020-11-01 (×5): qty 150

## 2020-11-01 MED ORDER — FENTANYL CITRATE (PF) 100 MCG/2ML IJ SOLN
INTRAMUSCULAR | Status: AC
  Administered 2020-11-01: 50 ug via INTRAVENOUS
  Filled 2020-11-01: qty 2

## 2020-11-01 MED ORDER — PROPOFOL 10 MG/ML IV BOLUS
INTRAVENOUS | Status: AC
  Filled 2020-11-01: qty 20

## 2020-11-01 MED ORDER — HYDROMORPHONE HCL 1 MG/ML IJ SOLN: 1.0000 mg | INTRAMUSCULAR | Status: DC | PRN

## 2020-11-01 MED ORDER — FENTANYL CITRATE (PF) 100 MCG/2ML IJ SOLN
INTRAMUSCULAR | Status: DC | PRN
  Administered 2020-11-01: 100 ug via INTRAVENOUS
  Administered 2020-11-01: 25 ug via INTRAVENOUS

## 2020-11-01 SURGICAL SUPPLY — 30 items
BLADE SURG 15 STRL LF DISP TIS (BLADE) ×1 IMPLANT
BLADE SURG 15 STRL SS (BLADE) ×2
BNDG CONFORM 2 STRL LF (GAUZE/BANDAGES/DRESSINGS) ×2 IMPLANT
COVER SURGICAL LIGHT HANDLE (MISCELLANEOUS) ×4 IMPLANT
COVER WAND RF STERILE (DRAPES) ×2 IMPLANT
DRAIN PENROSE 1/4X12 LTX STRL (WOUND CARE) ×2 IMPLANT
ELECT REM PT RETURN 9FT ADLT (ELECTROSURGICAL) ×2
ELECTRODE REM PT RTRN 9FT ADLT (ELECTROSURGICAL) ×1 IMPLANT
GAUZE 4X4 16PLY RFD (DISPOSABLE) ×2 IMPLANT
GAUZE SPONGE 4X4 12PLY STRL (GAUZE/BANDAGES/DRESSINGS) ×2 IMPLANT
GLOVE BIO SURGEON STRL SZ8 (GLOVE) ×2 IMPLANT
GOWN STRL REUS W/ TWL LRG LVL3 (GOWN DISPOSABLE) ×1 IMPLANT
GOWN STRL REUS W/TWL LRG LVL3 (GOWN DISPOSABLE) ×2
KIT BASIN OR (CUSTOM PROCEDURE TRAY) ×2 IMPLANT
KIT TURNOVER KIT B (KITS) ×2 IMPLANT
MARKER SKIN DUAL TIP RULER LAB (MISCELLANEOUS) ×2 IMPLANT
NEEDLE HYPO 25GX1X1/2 BEV (NEEDLE) ×2 IMPLANT
NS IRRIG 1000ML POUR BTL (IV SOLUTION) ×2 IMPLANT
PACK SURGICAL SETUP 50X90 (CUSTOM PROCEDURE TRAY) ×2 IMPLANT
PAD ARMBOARD 7.5X6 YLW CONV (MISCELLANEOUS) ×4 IMPLANT
PENCIL BUTTON HOLSTER BLD 10FT (ELECTRODE) ×2 IMPLANT
POSITIONER HEAD DONUT 9IN (MISCELLANEOUS) ×2 IMPLANT
PUTTY BONE DBX 2.5 MIS (Bone Implant) ×2 IMPLANT
SUT CHROMIC 3 0 SH 27 (SUTURE) ×2 IMPLANT
SUT VIC AB 4-0 PS2 18 (SUTURE) ×2 IMPLANT
SWAB COLLECTION DEVICE MRSA (MISCELLANEOUS) ×2 IMPLANT
SWAB CULTURE ESWAB REG 1ML (MISCELLANEOUS) ×2 IMPLANT
SYR BULB IRRIG 60ML STRL (SYRINGE) ×2 IMPLANT
SYR CONTROL 10ML LL (SYRINGE) ×2 IMPLANT
TUBE CONNECTING 12X1/4 (SUCTIONS) ×2 IMPLANT

## 2020-11-01 NOTE — Transfer of Care (Signed)
Immediate Anesthesia Transfer of Care Note  Patient: Trevor Simmons  Procedure(s) Performed: INCISION AND DRAINAGE ABSCESS POSSIBLE CYST REMOVAL MANDIBLE (Left )  Patient Location: PACU  Anesthesia Type:General  Level of Consciousness: awake and alert   Airway & Oxygen Therapy: Patient Spontanous Breathing and Patient connected to nasal cannula oxygen  Post-op Assessment: Report given to RN and Post -op Vital signs reviewed and stable  Post vital signs: Reviewed and stable  Last Vitals:  Vitals Value Taken Time  BP 133/75 11/01/20 1653  Temp    Pulse 98 11/01/20 1655  Resp 15 11/01/20 1655  SpO2 100 % 11/01/20 1655  Vitals shown include unvalidated device data.  Last Pain:  Vitals:   11/01/20 1416  TempSrc:   PainSc: 9          Complications: No complications documented.

## 2020-11-01 NOTE — Progress Notes (Signed)
  Date: 11/01/2020  Patient name: Trevor Simmons  Medical record number: [id number]  Date of birth: Oct 27, 1980   I have seen and evaluated Annette Stable and discussed their care with the Residency Team.  In brief, patient is a 40 year old male with no pertinent past medical history who presented to the ED with left-sided facial pain over the last 6 days.  Patient did have a history of infection from left lower wisdom tooth approximately 17 years ago with infection of his jaw.  Patient was offered invasive surgery versus I&D and antibiotic treatment at that time.  Patient opted for I&D and antibiotic treatment for a year and a half.  Patient states that since then he has been doing well.  Over the last 6 days he has noted progressive left-sided facial pain and swelling which is progressed to pain over his entire head.  No trauma, no fevers or chills, no lightheadedness, no syncope, no focal weakness, no tingling or numbness, no headache, no blurry vision, no chest pain, no shortness of breath, no difficulty with swallowing secretions.  He does complain of decreased oral intake from difficulty chewing and swallowing foods.  Today, patient states that his does improve with the pain medication but overall feels the same.  PMHx, Fam Hx, and/or Soc Hx : As per resident admit note  Vitals:   11/01/20 0428 11/01/20 1218  BP: 123/70 115/68  Pulse: 74 (!) 55  Resp: 18 16  Temp: 98.1 F (36.7 C) 98.3 F (36.8 C)  SpO2: 99% 100%   General: Awake, alert, oriented x3, NAD CVS: Regular rhythm, normal heart sounds Lungs: CTA bilaterally Abdomen: Soft, nontender, nondistended, normoactive bowel sounds Extremities: No edema noted, nontender to palpation Psych: Normal mood and affect HEENT: Tenderness to palpation over left cheek and preauricular area with mild edema and increased local warmth over the area.  Patient does have difficulty opening his mouth.  No pharyngeal erythema noted. Skin:  Warm and dry  Assessment and Plan: I have seen and evaluated the patient as outlined above. I agree with the formulated Assessment and Plan as detailed in the residents' note, with the following changes:   1.  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  Patient has no leukocytosis or fevers.  Imaging of the area is consistent with an acute on chronic osteomyelitis of the left mandible. -We will continue with Unasyn and vancomycin but consider DC vancomycin in a.m pending cultures. -We will follow up biopsy and possible I&D by oral surgery -Continue with pain control for now -Would consider soft diet after the procedure -No further work-up at this time  Earl Lagos, MD 3/21/20224:42 PM

## 2020-11-01 NOTE — Op Note (Signed)
NAME: GIANLUCA, CHHIM MEDICAL RECORD NO: [id number] ACCOUNT NO: [id number] DATE OF BIRTH: [birth year] FACILITY: [facility] LOCATION: [facility] PHYSICIAN: Georgia Lopes, DDS  Operative Report   DATE OF PROCEDURE: 11/01/2020  PREOPERATIVE DIAGNOSIS:  Osteomyelitis, left mandible, possible Brodie's abscess.  POSTOPERATIVE DIAGNOSIS:  Cyst left mandibular ramus, possible osteomyelitis, possible Brodie's abscess.  PROCEDURES:  Exploration of left mandible; excision of cyst, left mandible subcondylar area; debridement of tract from left subcondylar cyst to third molar area; bone graft, left mandible.  SURGEON:  Georgia Lopes, DDS  ANESTHESIA:  General, nasal intubation.  Fitzgerald attending.  INDICATIONS FOR PROCEDURE:  The patient is a 40 year old male who reported to the ER yesterday complaining of left facial pain.  CT and MRI were obtained, which demonstrated a cyst in the left mandible in the subcondylar area with a tract inside the  Mandibular ramus that extended to  the third molar area.  The patient had a history of some sort of surgical complication 17 years ago when he had his wisdom teeth removed, which required over a year to heal and multiple surgeries.  The CT raised the possibility of a  possible Brodie's abscess, and so the patient was scheduled for surgery.  DESCRIPTION OF PROCEDURE:  The patient was taken to the operating room and placed on the table in supine position.  General anesthesia was administered.  A nasal endotracheal tube was placed and secured.  The eyes were protected.  The patient was draped  for surgery.  Timeout was performed.  The posterior pharynx was suctioned.  A throat pack was placed.  1% lidocaine with 1:100,000 epinephrine was infiltrated into inferior alveolar block and buccal infiltration in the left mandible.  Then, a 15 blade  was used to make an incision beginning at the embrasure between teeth numbers 18 and 19 on the buccal aspect, and it was carried  posteriorly in the gingival sulcus until the crest of the mandible body was reached.  Then, this incision was carried upwards  along the ascending ramus.  The periosteum was reflected, and there was dense tissue in the third molar socket region.  Bone had not reformed in this area.  The tissue was dissected and removed with a dental rongeur and sent for pathological  examination.  There was a cleft in the bone between the buccal and lingual cortex of the mandible, which was approximately 1.2 cm at the base and tapered to approximately 5 mm in circumference as it went up the curvature of the ramus, which appeared to go upwards under the ascending border of the ramus. Dissection of the ascending ramus was done using a notched  Obwegeser retractor and then followed by the placement of a curved Kelly clamp in the subcondylar region to retract the tissues superiorly.  The Stryker handpiece with a neuro bur was then used to unroof the intrabony canal  until this  tracked up to the cystic area in the subcondylar region.  The bone was  removed until the cystic lesion could be identified.  However, caution was taken not to  remove too much of the bone in the subcondylar region for fear of possible fracture. Upon reaching the cyst, there was some yellow exudate, which was sent for culture.  The cyst was then enucleated and debrided using a Woodson and bone curettes. Because of limited access to the cyst, it was not possible to remove it in toto.   The cystic tissue was sent for biopsy in formalin as well  as culture, and then the area was irrigated, debrided, and then bone granules were placed in the area, and  then 4-0 Vicryl was used horizontal mattress sutures and continuous interlocking sutures to close the incision.  The inner proximal incision was closed with 3-0 chromic.  Then, additional local anesthetic was administered.  The patient was irrigated and  the throat pack was removed.  The patient was left under  the care of anesthesia for extubation and transport to recovery room with plans for discharge home through day surgery.  ESTIMATED BLOOD LOSS:  Minimum.  COMPLICATIONS:  None.  SPECIMEN:  Cyst, left mandible; cyst, third molar area.  Aerobic and anaerobic cultures were taken as well.   ROH D: 11/01/2020 4:50:53 pm T: 11/01/2020 7:25:00 pm  JOB: [id number]/ [id number]

## 2020-11-01 NOTE — Anesthesia Preprocedure Evaluation (Addendum)
Anesthesia Evaluation  Patient identified by MRN, date of birth, ID band Patient awake    Reviewed: Allergy & Precautions, H&P , NPO status , Patient's Chart, lab work & pertinent test results  Airway Mallampati: III   Neck ROM: Full  Mouth opening: Limited Mouth Opening  Dental no notable dental hx. (+) Teeth Intact, Dental Advisory Given   Pulmonary neg pulmonary ROS,    Pulmonary exam normal breath sounds clear to auscultation       Cardiovascular negative cardio ROS   Rhythm:Regular Rate:Normal     Neuro/Psych negative neurological ROS  negative psych ROS   GI/Hepatic negative GI ROS, Neg liver ROS,   Endo/Other  negative endocrine ROS  Renal/GU negative Renal ROS  negative genitourinary   Musculoskeletal   Abdominal   Peds  Hematology negative hematology ROS (+)   Anesthesia Other Findings   Reproductive/Obstetrics negative OB ROS                            Anesthesia Physical Anesthesia Plan  ASA: I  Anesthesia Plan: General   Post-op Pain Management:    Induction: Intravenous  PONV Risk Score and Plan: 3 and Ondansetron, Dexamethasone and Midazolam  Airway Management Planned: Nasal ETT and Video Laryngoscope Planned  Additional Equipment:   Intra-op Plan:   Post-operative Plan: Extubation in OR  Informed Consent: I have reviewed the patients History and Physical, chart, labs and discussed the procedure including the risks, benefits and alternatives for the proposed anesthesia with the patient or authorized representative who has indicated his/her understanding and acceptance.     Dental advisory given  Plan Discussed with: CRNA  Anesthesia Plan Comments:         Anesthesia Quick Evaluation

## 2020-11-01 NOTE — Progress Notes (Signed)
HD#1 Subjective:  Overnight Events: Admitted yesterday  Trevor Simmons states that the pain and swelling in his mouth remains the same although now is only on the left side with pain medications, which are helping control his pain.Marland Kitchen He states that he has a second boil on the same side on his upper gums that has been there for more than a year, although does not have pain in this area. Denies any fevers or chills. Discussed that he will need to follow up with his surgeon and a regular dentist after he leaves the hospital.   Objective:  Vital signs in last 24 hours: Vitals:   10/31/20 1730 10/31/20 2004 11/01/20 0025 11/01/20 0428  BP: 112/73 117/83 119/73 123/70  Pulse: (!) 54 70 62 74  Resp: 20 16 16 18   Temp: 98.2 F (36.8 C) 98.7 F (37.1 C) 98.5 F (36.9 C) 98.1 F (36.7 C)  TempSrc: Oral Oral Oral Oral  SpO2: 99% 100% 100% 99%  Weight:      Height:       Supplemental O2: Room Air SpO2: 99 %   Physical Exam:  Constitutional: Uncomfortable appearing, in no active acute distress HENT: normocephalic atraumatic, mucous membranes moist. Tender to palpate over the left preauricular area.  Mild edema and warmth to the area.  Trismus present. No evidence of dental abscess or drainage.  No pharyngeal erythema appreciated. Eyes: conjunctiva non-erythematous Neck: supple Cardiovascular: regular rate and rhythm, no m/r/g Pulmonary/Chest: normal work of breathing on room air, lungs clear to auscultation bilaterally Abdominal: soft, non-tender, non-distended MSK: normal bulk and tone Neurological: alert & oriented x 3, 5/5 strength in bilateral upper and lower extremities, normal gait Skin: warm and dry Psych: Normal mood  Filed Weights   10/31/20 0049  Weight: 72.6 kg     Intake/Output Summary (Last 24 hours) at 11/01/2020 0755 Last data filed at 10/31/2020 1830 Gross per 24 hour  Intake 308.37 ml  Output -  Net 308.37 ml   Net IO Since Admission: 308.37 mL [11/01/20  0755]  Pertinent Labs: CBC Latest Ref Rng & Units 11/01/2020 10/31/2020 10/31/2020  WBC 4.0 - 10.5 K/uL 9.1 8.6 -  Hemoglobin 13.0 - 17.0 g/dL 11/02/2020 25.3 66.4  Hematocrit 39.0 - 52.0 % 40.2 40.4 41.0  Platelets 150 - 400 K/uL 231 240 -    CMP Latest Ref Rng & Units 10/31/2020 10/31/2020 08/29/2019  Glucose 70 - 99 mg/dL 08/31/2019) 474(Q) 595(G)  BUN 6 - 20 mg/dL 15 38(V) 14  Creatinine 0.61 - 1.24 mg/dL 56(E 3.32 9.51  Sodium 135 - 145 mmol/L 136 140 141  Potassium 3.5 - 5.1 mmol/L 3.7 4.1 4.1  Chloride 98 - 111 mmol/L 103 103 101  CO2 22 - 32 mmol/L 25 - 27  Calcium 8.9 - 10.3 mg/dL 8.84) - 9.3  Total Protein 6.5 - 8.1 g/dL 6.9 - 6.8  Total Bilirubin 0.3 - 1.2 mg/dL 1.2 - 0.6  Alkaline Phos 38 - 126 U/L 56 - 54  AST 15 - 41 U/L 14(L) - 13  ALT 0 - 44 U/L 13 - 11    Imaging: MR FACE/TRIGEMINAL WO/W CM  Result Date: 10/31/2020 CLINICAL DATA:  41 year old male with facial pain, reports pain and swelling anterior to the left ear. Abnormal left mandible ramus on face CT with contrast earlier today. EXAM: MRI FACE TRIGEMINAL WITHOUT AND WITH CONTRAST TECHNIQUE: Multiplanar, multiecho pulse sequences of the face and surrounding structures, including thin slice imaging of the course of the  Trigeminal Nerves, were obtained both before and after administration of intravenous contrast. CONTRAST:  26mL GADAVIST GADOBUTROL 1 MMOL/ML IV SOLN COMPARISON:  Face CT with contrast today. Also head CT 11/12/2019. FINDINGS: Visible pharynx, parapharyngeal spaces, retropharyngeal space, and all the visible soft tissue spaces of the right face and neck are negative. There is only trace paranasal sinus mucosal thickening. Orbits are negative. Major intracranial vascular flow voids are preserved. Negative visible brain parenchyma. Furthermore, the left stylomastoid foramen is normal. Visible internal auditory structures appear normal. Mastoids are clear. Visible bone marrow signal is normal except in the left mandible  beginning at the left mandible wisdom tooth extraction site. The left mandible ramus is abnormally sclerotic and enlarged (series 11, image 13) with a linear internal intermediate signal T1 and T2 channel tracking from the tooth extraction site to a mildly complex appearing oval 18 mm cyst which has chronically scalloped the medial subcondylar region. And in addition to the CT face findings earlier today, prior head CT from March of last year also demonstrates the abnormal subcondylar changes although the cyst and scalloping appear enlarged from that time (11/12/2019 series 4, image 1). Following contrast there is no enhancement of the sclerotic mandible. But both the linear abnormal intra-osseous tract, and the majority of the surrounding left muscles of mastication do abnormally enhance (see series 14, image 13). Abnormal enhancement of the intra osseous tract continues to the wisdom tooth extraction site as seen on series 13, image 1. There is associated moderate to severe increased T2 and STIR hyperintensity within the left masseter muscle (series 12, image 7). The cyst in the subcondylar region which remodels the bone demonstrates a thin rim of enhancement superiorly and medially (series 13, image 12). The nearby left TMJ seems largely unaffected with no joint fluid. IMPRESSION: 1. 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 Also a chronic but enlarged cystic lesion of the medial subcondylar mandible associated with that tract has complex fluid contents and partially rim enhances also. 2. This constellation of findings is most compatible with acute on chronic Infection, and favor a Brodie Abscess - type disease  mechanism within the left mandible. Sampling of the tooth extraction site might be the next best step. 3. The face elsewhere, the left middle ear, and the visible brain appear normal. Study discussed by telephone with PA Loleta Dicker in the ED at 0856 hours on 10/31/2020. Electronically Signed   By: Odessa Fleming M.D.   On: 10/31/2020 09:06    Assessment/Plan:   Active Problems:   Osteomyelitis of jaw   Patient Summary: Trevor Simmons is a 40 y.o. 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  Continuing Vanco and switching to Unasyn for anaerobic coverage. -Vancomycin and Unasyn day 2.  Will narrow, pending culture. -Biopsy and possible I&D tomorrow per oral surgery, appreciate their assistance -Pain control with Tylenol and 1 mg morphine -Stool softener as needed -N.p.o. today, diet after procedure  Hyperlipidemia Patient with hyperlipidemia on prior lipid panel.  With an age of 13, unable to use ASCVD risk score calculator.  Due to age, will hold off on treatment at this time.  Diet:  N.p.o., resume soft diet after procedure VTE: Heparin IVF: None,None Code: Full  Dispo: Anticipated discharge to Home in 1-2 days pending further work-up by oral surgery and IV antibiotics.  Thalia Bloodgood DO Internal Medicine Resident PGY-1 Pager [phone] Please contact the on call pager after 5 pm and on weekends at [phone].

## 2020-11-01 NOTE — TOC Initial Note (Signed)
Transition of Care [facility]) - Initial/Assessment Note    Patient Details  Name: Trevor Simmons MRN: [id number] Date of Birth: 04-14-1981  [facility]) CM/SW Contact:    Kingsley Plan, RN Phone Number: 11/01/2020, 11:18 AM  Clinical Narrative:                  Patient's PCP Flemming at [facility] last seen in August 2021. Called first available appointment Dec 28, 2020. Placed on AVS.  Changed pharmacy to [facility] will await discharge scripts.  Expected Discharge Plan: Home/Self Care Barriers to Discharge: Continued Medical Work up   Patient Goals and CMS Choice        Expected Discharge Plan and Services Expected Discharge Plan: Home/Self Care   Discharge Planning Services: CM Consult,Indigent Health [facility] Program,Medication Assistance   Living arrangements for the past 2 months: Single Family Home                   DME Agency: NA       HH Arranged: NA          Prior Living Arrangements/Services Living arrangements for the past 2 months: Single Family Home Lives with:: Spouse Patient language and need for interpreter reviewed:: Yes Do you feel safe going back to the place where you live?: Yes            Criminal Activity/Legal Involvement Pertinent to Current Situation/Hospitalization: No - Comment as needed  Activities of Daily Living Home Assistive Devices/Equipment: None ADL Screening (condition at time of admission) Patient's cognitive ability adequate to safely complete daily activities?: Yes Is the patient deaf or have difficulty hearing?: No Does the patient have difficulty seeing, even when wearing glasses/contacts?: No Does the patient have difficulty concentrating, remembering, or making decisions?: No Patient able to express need for assistance with ADLs?: Yes Does the patient have difficulty dressing or bathing?: No Independently performs ADLs?: Yes (appropriate for developmental age) Does the patient  have difficulty walking or climbing stairs?: No Weakness of Legs: None Weakness of Arms/Hands: None  Permission Sought/Granted   Permission granted to share information with : No              Emotional Assessment              Admission diagnosis:  Osteomyelitis of jaw [M27.2] Facial pain [R51.9] Patient Active Problem List   Diagnosis Date Noted  . Osteomyelitis of jaw 10/31/2020   PCP:  Claiborne Rigg, NP Pharmacy:   [facility] [address] [place]), [place] - [address] [place] [address] Phone: [phone] Fax: [phone]  [facility] [place], [place] - [address] [place] [address] Phone: [phone] Fax: [phone]     Social Determinants of Health (SDOH) Interventions    Readmission Risk Interventions No flowsheet data found.

## 2020-11-01 NOTE — Progress Notes (Addendum)
Pharmacy Antibiotic Note  Trevor Simmons is a 40 y.o. male presented to the ED 10/31/2020 with L-sided facial pain and swelling in area anterior to left ear x5 days with CT concerning for sclerotic appearance and concern of osteomyelitis.  Pharmacy has been consulted for Vancomycin dosing. Patient going for MRI. No antibiotics prior to this visit.   Pt has received one dose of zosyn in the ED. Cefepime was ordered on 3/21. D/w Dr. Evie Lacks and we will change to Unasyn instead for the additional anaerobes coverage due to site of infection.   Plan: Change vanc to 750mg  IV q8 hours. AUC 478, scr 1.04 Unasyn 3g IV q8 Level as needed   Height: 5\' 10"  (177.8 cm) Weight: 72.6 kg (160 lb) IBW/kg (Calculated) : 73  Temp (24hrs), Avg:98.3 F (36.8 C), Min:97.9 F (36.6 C), Max:98.7 F (37.1 C)  Recent Labs  Lab 10/31/20 0509 10/31/20 0618 10/31/20 1333 11/01/20 0704  WBC  --  8.6  --  9.1  CREATININE 1.00  --  1.03 1.04    Estimated Creatinine Clearance: 99.9 mL/min (by C-G formula based on SCr of 1.04 mg/dL).    No Known Allergies  Antimicrobials this admission: Zosyn 3/20 x1  Vancomycin 3/20 >> Unasyn 3/21>>  Dose adjustments this admission:   Microbiology results: none  4/20, PharmD, BCIDP, AAHIVP, CPP Infectious Disease Pharmacist 11/01/2020 8:35 AM

## 2020-11-01 NOTE — Anesthesia Procedure Notes (Signed)
Procedure Name: Intubation Date/Time: 11/01/2020 3:33 PM Performed by: Sonda Primes, CRNA Pre-anesthesia Checklist: Patient identified, Emergency Drugs available, Suction available and Patient being monitored Patient Re-evaluated:Patient Re-evaluated prior to induction Oxygen Delivery Method: Circle System Utilized and Circle system utilized Preoxygenation: Pre-oxygenation with 100% oxygen Induction Type: IV induction Ventilation: Mask ventilation without difficulty Laryngoscope Size: Glidescope and 3 Grade View: Grade I Nasal Tubes: Nasal Rae, Nasal prep performed and Right Tube size: 7.5 mm Number of attempts: 1 Placement Confirmation: ETT inserted through vocal cords under direct vision,  positive ETCO2 and breath sounds checked- equal and bilateral Tube secured with: Tape Dental Injury: Teeth and Oropharynx as per pre-operative assessment  Comments: Nasal dilators x 3 prior to insertion nasal ETT. Passed under direct visualization of cords via glidescope + ETCO2, +BBS

## 2020-11-01 NOTE — H&P (Signed)
H&P documentation  -History and Physical Reviewed  -Patient has been re-examined  -No change in the plan of care  Trevor Simmons  

## 2020-11-01 NOTE — Op Note (Signed)
10/31/2020 - 11/01/2020  4:42 PM  PATIENT:  Trevor Simmons  40 y.o. male  PRE-OPERATIVE DIAGNOSIS:  Osteomyelitis left mandible.  Possible BRODIE'S ABSCESS  POST-OPERATIVE DIAGNOSIS:  SAME  PROCEDURE:  Procedure(s): Excision cyst left mandible subcondyle, debride  tract from left subcondylar cyst to third molar area, bone graft left mandible  SURGEON:  Surgeon(s): Ocie Doyne, DMD  ANESTHESIA:   local and general  EBL:  minimal  DRAINS: none   SPECIMEN:  No Specimen  COUNTS:  YES  PLAN OF CARE: TO FLOOR after PACU  PATIENT DISPOSITION:  PACU - hemodynamically stable.   PROCEDURE DETAILS: Dictation # [id number]  Georgia Lopes, DMD 11/01/2020 4:42 PM

## 2020-11-02 ENCOUNTER — Ambulatory Visit: Payer: Self-pay | Admitting: Oral Surgery

## 2020-11-02 ENCOUNTER — Other Ambulatory Visit: Payer: Self-pay | Admitting: Oral Surgery

## 2020-11-02 ENCOUNTER — Encounter (HOSPITAL_COMMUNITY): Payer: Self-pay | Admitting: Oral Surgery

## 2020-11-02 ENCOUNTER — Inpatient Hospital Stay: Payer: Self-pay

## 2020-11-02 DIAGNOSIS — M272 Inflammatory conditions of jaws: Principal | ICD-10-CM

## 2020-11-02 DIAGNOSIS — K047 Periapical abscess without sinus: Secondary | ICD-10-CM

## 2020-11-02 DIAGNOSIS — T86832 Bone graft infection: Secondary | ICD-10-CM

## 2020-11-02 LAB — BASIC METABOLIC PANEL
Anion gap: 8 (ref 5–15)
BUN: 11 mg/dL (ref 6–20)
CO2: 26 mmol/L (ref 22–32)
Calcium: 8.7 mg/dL — ABNORMAL LOW (ref 8.9–10.3)
Chloride: 100 mmol/L (ref 98–111)
Creatinine, Ser: 1.06 mg/dL (ref 0.61–1.24)
GFR, Estimated: >60 mL/min (ref >60)
Glucose, Bld: 142 mg/dL — ABNORMAL HIGH (ref 70–99)
Potassium: 3.7 mmol/L (ref 3.5–5.1)
Sodium: 134 mmol/L — ABNORMAL LOW (ref 135–145)

## 2020-11-02 LAB — CBC
HCT: 39.8 % (ref 39.0–52.0)
Hemoglobin: 13.7 g/dL (ref 13.0–17.0)
MCH: 28.4 pg (ref 26.0–34.0)
MCHC: 34.4 g/dL (ref 30.0–36.0)
MCV: 82.6 fL (ref 80.0–100.0)
Platelets: 250 10*3/uL (ref 150–400)
RBC: 4.82 MIL/uL (ref 4.22–5.81)
RDW: 12.1 % (ref 11.5–15.5)
WBC: 13.6 10*3/uL — ABNORMAL HIGH (ref 4.0–10.5)
nRBC: 0 % (ref 0.0–0.2)
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
## 2020-11-02 NOTE — Care Management (Signed)
NCM following for discharge needs. Changed pharmacy to [facility] will assist with medications at discharge.   Scheduled follow up appointment at [facility] , first available Dec 28, 2020.   If IV ABX at home needed please notify [facility] Team.   Thanks   Ronny Flurry RN

## 2020-11-02 NOTE — Progress Notes (Signed)
Trevor Simmons PROGRESS NOTE:   SUBJECTIVE: Minimal pain. Eating and swallowing ok.  OBJECTIVE:  Vitals: Blood pressure 117/70, pulse 70, temperature 98.4 F (36.9 C), temperature source Oral, resp. rate 16, height 5\' 10"  (1.778 m), weight 72.6 kg, SpO2 100 %. Lab results: Results for orders placed or performed during the hospital encounter of 10/31/20 (from the past 24 hour(s))  CBC     Status: Abnormal   Collection Time: 11/02/20 12:28 AM  Result Value Ref Range   WBC 13.6 (H) 4.0 - 10.5 K/uL   RBC 4.82 4.22 - 5.81 MIL/uL   Hemoglobin 13.7 13.0 - 17.0 g/dL   HCT 11/04/20 78.2 - 95.6 %   MCV 82.6 80.0 - 100.0 fL   MCH 28.4 26.0 - 34.0 pg   MCHC 34.4 30.0 - 36.0 g/dL   RDW 21.3 08.6 - 57.8 %   Platelets 250 150 - 400 K/uL   nRBC 0.0 0.0 - 0.2 %  Basic metabolic panel     Status: Abnormal   Collection Time: 11/02/20 12:28 AM  Result Value Ref Range   Sodium 134 (L) 135 - 145 mmol/L   Potassium 3.7 3.5 - 5.1 mmol/L   Chloride 100 98 - 111 mmol/L   CO2 26 22 - 32 mmol/L   Glucose, Bld 142 (H) 70 - 99 mg/dL   BUN 11 6 - 20 mg/dL   Creatinine, Ser 11/04/20 0.61 - 1.24 mg/dL   Calcium 8.7 (L) 8.9 - 10.3 mg/dL   GFR, Estimated 6.29 >52 mL/min   Anion gap 8 5 - 15   Radiology Results: MR FACE/TRIGEMINAL WO/W CM  Result Date: 10/31/2020 CLINICAL DATA:  40 year old male with facial pain, reports pain and swelling anterior to the left ear. Abnormal left mandible ramus on face CT with contrast earlier today. EXAM: MRI FACE TRIGEMINAL WITHOUT AND WITH CONTRAST TECHNIQUE: Multiplanar, multiecho pulse sequences of the face and surrounding structures, including thin slice imaging of the course of the Trigeminal Nerves, were obtained both before and after administration of intravenous contrast. CONTRAST:  42mL GADAVIST GADOBUTROL 1 MMOL/ML IV SOLN COMPARISON:  Face CT with contrast today. Also head CT 11/12/2019. FINDINGS: Visible pharynx, parapharyngeal spaces, retropharyngeal space, and all the  visible soft tissue spaces of the right face and neck are negative. There is only trace paranasal sinus mucosal thickening. Orbits are negative. Major intracranial vascular flow voids are preserved. Negative visible brain parenchyma. Furthermore, the left stylomastoid foramen is normal. Visible internal auditory structures appear normal. Mastoids are clear. Visible bone marrow signal is normal except in the left mandible beginning at the left mandible wisdom tooth extraction site. The left mandible ramus is abnormally sclerotic and enlarged (series 11, image 13) with a linear internal intermediate signal T1 and T2 channel tracking from the tooth extraction site to a mildly complex appearing oval 18 mm cyst which has chronically scalloped the medial subcondylar region. And in addition to the CT face findings earlier today, prior head CT from March of last year also demonstrates the abnormal subcondylar changes although the cyst and scalloping appear enlarged from that time (11/12/2019 series 4, image 1). Following contrast there is no enhancement of the sclerotic mandible. But both the linear abnormal intra-osseous tract, and the majority of the surrounding left muscles of mastication do abnormally enhance (see series 14, image 13). Abnormal enhancement of the intra osseous tract continues to the wisdom tooth extraction site as seen on series 13, image 1. There is associated moderate to severe increased  T2 and STIR hyperintensity within the left masseter muscle (series 12, image 7). The cyst in the subcondylar region which remodels the bone demonstrates a thin rim of enhancement superiorly and medially (series 13, image 12). The nearby left TMJ seems largely unaffected with no joint fluid. IMPRESSION: 1. 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 Also a chronic but enlarged cystic lesion of the medial subcondylar mandible associated with that tract has complex fluid contents and partially rim enhances also. 2. 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 Sampling of the tooth extraction site might be the next best step. 3. The face elsewhere, the left middle ear, and the visible brain appear normal. Study discussed by telephone with PA Loleta Dicker in the ED at 0856 hours on 10/31/2020. Electronically Signed   By: Odessa Fleming M.D.   On: 10/31/2020 09:06   General appearance: alert and no distress Head: Normocephalic, without obvious abnormality, atraumatic Eyes: negative Nose: Nares normal. Septum midline. Mucosa normal. No drainage or sinus tenderness. Throat: Mild  left buccal edema. Hemostatic. Pharynx clear. Trismus to approx 10mm. Neck: no adenopathy  ASSESSMENT: Simmons s/p Excision cyst, debridement with bone graft left mandible  PLAN: Continue IV atibiotics. Cultures/surg path pending.    Ocie Doyne 11/02/2020

## 2020-11-02 NOTE — Anesthesia Postprocedure Evaluation (Signed)
Anesthesia Post Note  Patient: Trevor Simmons  Procedure(s) Performed: INCISION AND DRAINAGE ABSCESS POSSIBLE CYST REMOVAL MANDIBLE (Left )     Patient location during evaluation: PACU Anesthesia Type: General Level of consciousness: awake and alert Pain management: pain level controlled Vital Signs Assessment: post-procedure vital signs reviewed and stable Respiratory status: spontaneous breathing, nonlabored ventilation and respiratory function stable Cardiovascular status: blood pressure returned to baseline and stable Postop Assessment: no apparent nausea or vomiting Anesthetic complications: no   No complications documented.  Last Vitals:  Vitals:   11/01/20 2351 11/02/20 0401  BP: (!) 141/76 117/70  Pulse: 86 70  Resp: 16 16  Temp: 36.7 C 36.9 C  SpO2: 100% 100%    Last Pain:  Vitals:   11/02/20 0401  TempSrc: Oral  PainSc:                  Trevor Simmons,W. EDMOND

## 2020-11-02 NOTE — Progress Notes (Signed)
HD#2 Subjective:  Overnight Events: Surgery yesterday  Mr. Modica states his pain has significantly improved after his procedure. The only area that bothers him currently is his left lower jaw and he feels that food gets stuck in his left upper cheek. He says his jaw doesn't feel as strong as it had, although is able to eat otherwise. Denies any fevers, chills, CP or back pain. Discussed he will need to stay for IV antibiotics.    Objective:  Vital signs in last 24 hours: Vitals:   11/01/20 1734 11/01/20 2020 11/01/20 2351 11/02/20 0401  BP: 99/81 124/81 (!) 141/76 117/70  Pulse: 66 70 86 70  Resp: 16 14 16 16   Temp: 97.9 F (36.6 C) 98.7 F (37.1 C) 98.1 F (36.7 C) 98.4 F (36.9 C)  TempSrc: Oral Oral Oral Oral  SpO2: 100% 100% 100% 100%  Weight:      Height:       Supplemental O2: Room Air SpO2: 100 %   Physical Exam:  Constitutional: Uncomfortable appearing, in no active acute distress HENT: normocephalic atraumatic, mucous membranes moist. Tender to palpate over the left preauricular area.  Mild edema and warmth to the area.  Trismus present. No evidence of dental abscess or drainage.  No pharyngeal erythema appreciated. Eyes: conjunctiva non-erythematous Neck: supple Cardiovascular: regular rate and rhythm, no m/r/g Pulmonary/Chest: normal work of breathing on room air, lungs clear to auscultation bilaterally Abdominal: soft, non-tender, non-distended MSK: normal bulk and tone Neurological: alert & oriented x 3, 5/5 strength in bilateral upper and lower extremities, normal gait Skin: warm and dry Psych: Normal mood  Filed Weights   10/31/20 0049  Weight: 72.6 kg     Intake/Output Summary (Last 24 hours) at 11/02/2020 11/04/2020 Last data filed at 11/01/2020 1805 Gross per 24 hour  Intake 985.92 ml  Output 30 ml  Net 955.92 ml   Net IO Since Admission: 1,264.29 mL [11/02/20 0633]  Pertinent Labs: CBC Latest Ref Rng & Units 11/02/2020 11/01/2020 10/31/2020   WBC 4.0 - 10.5 K/uL 13.6(H) 9.1 8.6  Hemoglobin 13.0 - 17.0 g/dL 11/02/2020 92.4 26.8  Hematocrit 39.0 - 52.0 % 39.8 40.2 40.4  Platelets 150 - 400 K/uL 250 231 240    CMP Latest Ref Rng & Units 11/02/2020 11/01/2020 10/31/2020  Glucose 70 - 99 mg/dL 11/02/2020) 962(I) 297(L)  BUN 6 - 20 mg/dL 11 12 15   Creatinine 0.61 - 1.24 mg/dL 892(J 1.94  Sodium 135 - 145 mmol/L 134(L) 136 136  Potassium 3.5 - 5.1 mmol/L 3.7 3.8 3.7  Chloride 98 - 111 mmol/L 100 103 103  CO2 22 - 32 mmol/L 26 24 25   Calcium 8.9 - 10.3 mg/dL 1.74) 9.0 0.81)  Total Protein 6.5 - 8.1 g/dL - - 6.9  Total Bilirubin 0.3 - 1.2 mg/dL - - 1.2  Alkaline Phos 38 - 126 U/L - - 56  AST 15 - 41 U/L - - 14(L)  ALT 0 - 44 U/L - - 13    Imaging: No results found.  Assessment/Plan:   Active Problems:   Osteomyelitis of jaw   Patient Summary: Trevor Simmons is a 40 y.o. 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 Patient able tolerating soft diet well. Will talk with ID to discuss abx duration  -Unasyn day 3.  Will narrow, pending culture. -dental surgery following, will consult ID -Pain control with Tylenol and 1 mg morphine -Stool softener as needed  Hyperlipidemia Patient with hyperlipidemia on prior lipid panel.  With an age of 42, unable to use ASCVD risk score calculator.  Due to age, will hold off on treatment at this time.  Diet: soft diet VTE: Heparin IVF: None,None Code: Full  Dispo: Anticipated discharge to Home in 1-2 days pending further work-up by oral surgery and IV antibiotics.  Thalia Bloodgood DO Internal Medicine Resident PGY-1 Pager  [phone] Please contact the on call pager after 5 pm and on weekends at [phone].

## 2020-11-02 NOTE — Progress Notes (Signed)
Spoke to primary RN and made aware PICC insertion will be done tomorrow 3/23.

## 2020-11-03 DIAGNOSIS — R519 Headache, unspecified: Secondary | ICD-10-CM

## 2020-11-03 LAB — BASIC METABOLIC PANEL
Anion gap: 6 (ref 5–15)
BUN: 13 mg/dL (ref 6–20)
CO2: 28 mmol/L (ref 22–32)
Calcium: 8.5 mg/dL — ABNORMAL LOW (ref 8.9–10.3)
Chloride: 102 mmol/L (ref 98–111)
Creatinine, Ser: 1.06 mg/dL (ref 0.61–1.24)
GFR, Estimated: >60 mL/min (ref >60)
Glucose, Bld: 113 mg/dL — ABNORMAL HIGH (ref 70–99)
Potassium: 3.5 mmol/L (ref 3.5–5.1)
Sodium: 136 mmol/L (ref 135–145)

## 2020-11-03 LAB — CBC
HCT: 36.9 % — ABNORMAL LOW (ref 39.0–52.0)
Hemoglobin: 12.8 g/dL — ABNORMAL LOW (ref 13.0–17.0)
MCH: 28.5 pg (ref 26.0–34.0)
MCHC: 34.7 g/dL (ref 30.0–36.0)
MCV: 82.2 fL (ref 80.0–100.0)
Platelets: 235 10*3/uL (ref 150–400)
RBC: 4.49 MIL/uL (ref 4.22–5.81)
RDW: 12.2 % (ref 11.5–15.5)
WBC: 9.8 10*3/uL (ref 4.0–10.5)
nRBC: 0 % (ref 0.0–0.2)

## 2020-11-03 LAB — SURGICAL PATHOLOGY

## 2020-11-03 MED ORDER — CHLORHEXIDINE GLUCONATE CLOTH 2 % EX PADS
6.0000 | MEDICATED_PAD | Freq: Every day | CUTANEOUS | Status: DC
  Administered 2020-11-03: 6 via TOPICAL

## 2020-11-03 MED ORDER — SODIUM CHLORIDE 0.9% FLUSH
10.0000 mL | INTRAVENOUS | Status: DC | PRN
  Administered 2020-11-04: 10 mL

## 2020-11-03 MED ORDER — SODIUM CHLORIDE 0.9% FLUSH
10.0000 mL | Freq: Two times a day (BID) | INTRAVENOUS | Status: DC
  Administered 2020-11-03 – 2020-11-04 (×3): 10 mL

## 2020-11-03 MED ORDER — ONDANSETRON HCL 4 MG/2ML IJ SOLN
4.0000 mg | Freq: Once | INTRAMUSCULAR | Status: AC
  Administered 2020-11-03: 4 mg via INTRAVENOUS
  Filled 2020-11-03: qty 2

## 2020-11-03 NOTE — TOC Initial Note (Addendum)
Transition of Care [facility]) - Initial/Assessment Note    Patient Details  Name: Trevor Simmons MRN: [id number] Date of Birth: 28-Nov-1980  [facility] of [facility]) CM/SW Contact:    Kingsley Plan, RN Phone Number: 11/03/2020, 12:55 PM  Clinical Narrative:                  Sherron Monday to patient and wife [name] at bedside with assistance of status interpreter Juline Patch [phone].   Confirmed face sheet information.   PCP is Teacher, music at [employer], scheduled appointment and placed on AVS.    Discussed IV ABX at home. Infusion company will be [facility]). IV ABX will come from them. They have a nurse Pam who will teach patient and [name] how to administer IV ABX prior to discharge from the hospital because the home health nurse will not be present every time a dose is due.   [facility] will be home health agency.   To see if patient qualifies for charity care NCM asked financial  questions.  NCM provided information to [facility] .  Patient and wife asking for assistance for hospital bill. NCM emailed Artist. Also explained when they get hospital bill there will be a number on bill to call for assistance.    Patient and [name] voiced understanding to all of above.   Will need OPAT script and HHRN order.   Patient entered in [facility] for no co pay for Percocet 5/325  Expected Discharge Plan: Home w Home Health Services Barriers to Discharge: Continued Medical Work up   Patient Goals and CMS Choice Patient states their goals for this hospitalization and ongoing recovery are:: to return to home CMS Medicare.gov Compare Post Acute Care list provided to:: Patient Choice offered to / list presented to : [facility]  Expected Discharge Plan and Services Expected Discharge Plan: Home w Home Health Services   Discharge Planning Services: CM Consult Post Acute Care Choice: Home Health Living arrangements for the past 2 months:  Single Family Home                 DME Arranged: N/A DME Agency: NA       HH Arranged: RN HH AgencyHotel manager Home Health Care Date [facility] Agency Contacted: 11/03/20 Time HH Agency Contacted: 1255 Representative spoke with at [facility] Agency: Kandee Keen  Prior Living Arrangements/Services Living arrangements for the past 2 months: Single Family Home Lives with:: Spouse Patient language and need for interpreter reviewed:: Yes Do you feel safe going back to the place where you live?: Yes      Need for Family Participation in Patient Care: Yes (Comment) Care giver support system in place?: Yes (comment)   Criminal Activity/Legal Involvement Pertinent to Current Situation/Hospitalization: No - Comment as needed  Activities of Daily Living Home Assistive Devices/Equipment: None ADL Screening (condition at time of admission) Patient's cognitive ability adequate to safely complete daily activities?: Yes Is the patient deaf or have difficulty hearing?: No Does the patient have difficulty seeing, even when wearing glasses/contacts?: No Does the patient have difficulty concentrating, remembering, or making decisions?: No Patient able to express need for assistance with ADLs?: Yes Does the patient have difficulty dressing or bathing?: No Independently performs ADLs?: Yes (appropriate for developmental age) Does the patient have difficulty walking or climbing stairs?: No Weakness of Legs: None Weakness of Arms/Hands: None  Permission Sought/Granted   Permission granted to share information with : Yes, Verbal Permission Granted  Share  Information with NAME: [name] spouse  Permission granted to share info w AGENCY: [facility]) , [facility]        Emotional Assessment Appearance:: Appears stated age Attitude/Demeanor/Rapport: Engaged Affect (typically observed): Accepting Orientation: : Oriented to Self,Oriented to Place,Oriented to  Time,Oriented to Situation Alcohol / Substance Use:  Not Applicable Psych Involvement: No (comment)  Admission diagnosis:  Osteomyelitis of jaw [M27.2] Facial pain [R51.9] Patient Active Problem List   Diagnosis Date Noted  . Osteomyelitis of jaw 10/31/2020   PCP:  Claiborne Rigg, NP Pharmacy:   [facility] [address] [place]), [place] - [address] [place] [address] Phone: [phone] Fax: [phone]  [facility] [place], [place] - [address] [place] [address] Phone: [phone] Fax: [phone]     Social Determinants of Health (SDOH) Interventions    Readmission Risk Interventions No flowsheet data found.

## 2020-11-03 NOTE — Progress Notes (Addendum)
Annette Stable PROGRESS NOTE:   SUBJECTIVE: Pain controlled with percocet. Eating and drinking without difficulty  OBJECTIVE:  Vitals: Blood pressure 102/60, pulse 68, temperature 98.3 F (36.8 C), temperature source Oral, resp. rate 18, height 5\' 10"  (1.778 m), weight 72.6 kg, SpO2 100 %. Lab results: Results for orders placed or performed during the hospital encounter of 10/31/20 (from the past 24 hour(s))  CBC     Status: Abnormal   Collection Time: 11/03/20  1:13 AM  Result Value Ref Range   WBC 9.8 4.0 - 10.5 K/uL   RBC 4.49 4.22 - 5.81 MIL/uL   Hemoglobin 12.8 (L) 13.0 - 17.0 g/dL   HCT 11/05/20 (L) 74.0 - 81.4 %   MCV 82.2 80.0 - 100.0 fL   MCH 28.5 26.0 - 34.0 pg   MCHC 34.7 30.0 - 36.0 g/dL   RDW 48.1 85.6 - 31.4 %   Platelets 235 150 - 400 K/uL   nRBC 0.0 0.0 - 0.2 %  Basic metabolic panel     Status: Abnormal   Collection Time: 11/03/20  1:13 AM  Result Value Ref Range   Sodium 136 135 - 145 mmol/L   Potassium 3.5 3.5 - 5.1 mmol/L   Chloride 102 98 - 111 mmol/L   CO2 28 22 - 32 mmol/L   Glucose, Bld 113 (H) 70 - 99 mg/dL   BUN 13 6 - 20 mg/dL   Creatinine, Ser 11/05/20 0.61 - 1.24 mg/dL   Calcium 8.5 (L) 8.9 - 10.3 mg/dL   GFR, Estimated 2.63 >78 mL/min   Anion gap 6 5 - 15   Radiology Results: >58 EKG SITE RITE  Result Date: 11/02/2020 If Site Rite image not attached, placement could not be confirmed due to current cardiac rhythm.  General appearance: alert, cooperative and no distress Head: Normocephalic, without obvious abnormality, atraumatic Eyes: negative Nose: Nares normal. Septum midline. Mucosa normal. No drainage or sinus tenderness. Throat: Mild edema left surgery site. pharynx clear. Maximum opening approximately 1.5 cm Neck: no adenopathy  ASSESSMENT/PLAN: Improved clinically s/p cyst removal from mandible. Awaiting culture results regarding possible osteomyelitis.  Stable for D/C from oral surgery standpoint.     11/04/2020 11/03/2020

## 2020-11-03 NOTE — Progress Notes (Signed)
PHARMACY CONSULT NOTE FOR:  OUTPATIENT  PARENTERAL ANTIBIOTIC THERAPY (OPAT)  Indication: Mandibular osteomyelitis  Regimen: Ertapenem 1g IV q24h  End date: 11/29/2020  IV antibiotic discharge orders are pended. To discharging provider:  please sign these orders via discharge navigator,  Select New Orders & click on the button choice - Manage This Unsigned Work.     Thank you for allowing pharmacy to be a part of this patient's care.  Jeannetta Nap 11/03/2020, 3:26 PM

## 2020-11-03 NOTE — Progress Notes (Signed)
HD#3 Subjective:  Overnight Events: No acute events overnight   Reports that he is feeling so so, still having some pain Reports that he is able to eat but states that he doesn't want to chew too hard. Discussed that we spoke with the infectious disease doctors and they recommended 6 weeks of IV antibiotics, and then after they will switch to oral antibiotics. Discussed that it's a long course because the infection is in the bone. We will also follow up on the biopsies.   Patient also concerned about left upper portion of the mouth.  He states he feels like something is there, whether that be from the surgery or food getting stuck.  I asked the patient to bring this up to the oral surgeon, however he forgot.  All questions and concerns were addressed.   Objective:  Vital signs in last 24 hours: Vitals:   11/02/20 1205 11/02/20 1708 11/02/20 2305 11/03/20 0500  BP: 112/73 105/63 103/67 102/60  Pulse: 64 (!) 57 71 68  Resp: 17 17 16 18   Temp: 97.8 F (36.6 C) 98.4 F (36.9 C) 98.7 F (37.1 C) 98.3 F (36.8 C)  TempSrc: Oral Oral Oral Oral  SpO2: 100% 99% 100% 100%  Weight:      Height:       Supplemental O2: Room Air SpO2: 100 %   Physical Exam:  Constitutional: Uncomfortable appearing, in no active acute distress HENT: normocephalic atraumatic, mucous membranes moist.  Trismus present.  No active drainage seen. Eyes: conjunctiva non-erythematous Neck: supple Cardiovascular: regular rate and rhythm, no m/r/g Pulmonary/Chest: normal work of breathing on room air, lungs clear to auscultation bilaterally Abdominal: soft, non-tender, non-distended MSK: normal bulk and tone Neurological: alert & oriented x 3 Skin: warm and dry Psych: Normal mood  Filed Weights   10/31/20 0049  Weight: 72.6 kg     Intake/Output Summary (Last 24 hours) at 11/03/2020 1105 Last data filed at 11/02/2020 2015 Gross per 24 hour  Intake 800.16 ml  Output -  Net 800.16 ml   Net IO Since  Admission: 2,064.45 mL [11/03/20 1105]  Pertinent Labs: CBC Latest Ref Rng & Units 11/03/2020 11/02/2020 11/01/2020  WBC 4.0 - 10.5 K/uL 9.8 13.6(H) 9.1  Hemoglobin 13.0 - 17.0 g/dL 12.8(L) 13.7 13.9  Hematocrit 39.0 - 52.0 % 36.9(L) 39.8 40.2  Platelets 150 - 400 K/uL 235 250 231    CMP Latest Ref Rng & Units 11/03/2020 11/02/2020 11/01/2020  Glucose 70 - 99 mg/dL 11/03/2020) 128(N) 867(E)  BUN 6 - 20 mg/dL 13 11 12   Creatinine 0.61 - 1.24 mg/dL 720(N 4.70  Sodium 135 - 145 mmol/L 136 134(L) 136  Potassium 3.5 - 5.1 mmol/L 3.5 3.7 3.8  Chloride 98 - 111 mmol/L 102 100 103  CO2 22 - 32 mmol/L 28 26 24   Calcium 8.9 - 10.3 mg/dL 9.62) 8.36) 9.0  Total Protein 6.5 - 8.1 g/dL - - -  Total Bilirubin 0.3 - 1.2 mg/dL - - -  Alkaline Phos 38 - 126 U/L - - -  AST 15 - 41 U/L - - -  ALT 0 - 44 U/L - - -    Imaging: EKG SITE RITE  Result Date: 11/02/2020 If Site Rite image not attached, placement could not be confirmed due to current cardiac rhythm.   Assessment/Plan:   Active Problems:   Osteomyelitis of jaw   Patient Summary: Trevor Simmons is a 40 y.o. 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  Patient able to eat without difficulty.  Will discuss with nurse/patient that he can alternate between Tylenol and Percocets.  Oral surgery signed off, state patient has improved clinically.  ID consulted yesterday who recommend Unasyn while admitted, ertapenem once discharged for a month.  Oral antibiotics after that.  PICC line placed yesterday for anticipated discharge home tomorrow.  Working with TOC to set up OPAT/home health aid.  Reached out to  the oral surgeon, Dr. Barbette Merino, about area in the mouth patient is concerned about. -Unasyn day 3.  Will narrow, pending culture.   -PICC line in place  -At discharge ertapenem for 1 month, followed by oral antibiotics -Surgery signed off.  ID following.  Recommendations as per above -Pain control with Tylenol and Percocet every 4 hours -Stool softener as needed  Diet: soft diet VTE: Heparin IVF: None,None Code: Full  Dispo: Anticipated discharge to Home in 0 to 1 days pending setting up outpatient care  Thalia Bloodgood DO Internal Medicine Resident PGY-1 Pager [phone] Please contact the on call pager after 5 pm and on weekends at [phone].

## 2020-11-03 NOTE — Progress Notes (Signed)
Peripherally Inserted Central Catheter Placement  The IV Nurse has discussed with the patient and/or persons authorized to consent for the patient, the purpose of this procedure and the potential benefits and risks involved with this procedure.  The benefits include less needle sticks, lab draws from the catheter, and the patient may be discharged home with the catheter. Risks include, but not limited to, infection, bleeding, blood clot (thrombus formation), and puncture of an artery; nerve damage and irregular heartbeat and possibility to perform a PICC exchange if needed/ordered by physician.  Alternatives to this procedure were also discussed.  Bard Power PICC patient education guide, fact sheet on infection prevention and patient information card has been provided to patient /or left at bedside.    PICC Placement Documentation  PICC Single Lumen 11/03/20 PICC Right Basilic 40 cm 1 cm (Active)  Indication for Insertion or Continuance of Line Home intravenous therapies (PICC only) 11/03/20 0835  Exposed Catheter (cm) 1 cm 11/03/20 0835  Site Assessment Clean;Dry;Intact 11/03/20 0835  Line Status Flushed;Saline locked;Blood return noted 11/03/20 0835  Dressing Type Transparent;Securing device 11/03/20 0835  Dressing Status Clean;Dry;Intact 11/03/20 0835  Antimicrobial disc in place? Yes 11/03/20 0835  Dressing Intervention New dressing 11/03/20 0835  Dressing Change Due 11/10/20 11/03/20 0835       Annett Fabian 11/03/2020, 8:36 AM

## 2020-11-03 NOTE — Progress Notes (Signed)
Subjective: No new complaints   Antibiotics:  Anti-infectives (From admission, onward)   Start     Dose/Rate Route Frequency Ordered Stop   11/01/20 1700  ceFEPIme (MAXIPIME) 2 g in sodium chloride 0.9 % 100 mL IVPB  Status:  Discontinued        2 g 200 mL/hr over 30 Minutes Intravenous Every 8 hours 11/01/20 0814 11/01/20 0828   11/01/20 1000  vancomycin (VANCOREADY) IVPB 750 mg/150 mL  Status:  Discontinued        750 mg 150 mL/hr over 60 Minutes Intravenous Every 8 hours 11/01/20 0831 11/02/20 0924   11/01/20 0900  ceFEPIme (MAXIPIME) 2 g in sodium chloride 0.9 % 100 mL IVPB  Status:  Discontinued        2 g 200 mL/hr over 30 Minutes Intravenous  Once 11/01/20 0814 11/01/20 0828   11/01/20 0900  Ampicillin-Sulbactam (UNASYN) 3 g in sodium chloride 0.9 % 100 mL IVPB        3 g 200 mL/hr over 30 Minutes Intravenous Every 6 hours 11/01/20 0828     10/31/20 1800  vancomycin (VANCOREADY) IVPB 1000 mg/200 mL  Status:  Discontinued        1,000 mg 200 mL/hr over 60 Minutes Intravenous Every 8 hours 10/31/20 0933 11/01/20 0831   10/31/20 0945  vancomycin (VANCOREADY) IVPB 1500 mg/300 mL        1,500 mg 150 mL/hr over 120 Minutes Intravenous  Once 10/31/20 0932 10/31/20 1236   10/31/20 0915  piperacillin-tazobactam (ZOSYN) IVPB 3.375 g        3.375 g 100 mL/hr over 30 Minutes Intravenous  Once 10/31/20 0906 10/31/20 1000      Medications: Scheduled Meds: . Chlorhexidine Gluconate Cloth  6 each Topical Daily  . heparin  5,000 Units Subcutaneous Q8H  . sodium chloride flush  10-40 mL Intracatheter Q12H   Continuous Infusions: . ampicillin-sulbactam (UNASYN) IV 3 g (11/03/20 0903)  . dextrose 5 % and 0.45% NaCl Stopped (11/02/20 0837)   PRN Meds:.acetaminophen **OR** acetaminophen, oxyCODONE-acetaminophen, polyethylene glycol, sodium chloride flush, white petrolatum    Objective: Weight change:   Intake/Output Summary (Last 24 hours) at 11/03/2020 1353 Last data  filed at 11/02/2020 2015 Gross per 24 hour  Intake 800.16 ml  Output -  Net 800.16 ml   Blood pressure 111/64, pulse (!) 55, temperature 98.5 F (36.9 C), temperature source Oral, resp. rate 16, height 5\' 10"  (1.778 m), weight 72.6 kg, SpO2 100 %. Temp:  [98.3 F (36.8 C)-98.7 F (37.1 C)] 98.5 F (36.9 C) (03/23 1213) Pulse Rate:  [55-71] 55 (03/23 1213) Resp:  [16-18] 16 (03/23 1213) BP: (102-111)/(60-67) 111/64 (03/23 1213) SpO2:  [99 %-100 %] 100 % (03/23 1213)  Physical Exam: Physical Exam Constitutional:      Appearance: He is well-developed.  HENT:     Head: Normocephalic and atraumatic.   Eyes:     Conjunctiva/sclera: Conjunctivae normal.  Cardiovascular:     Rate and Rhythm: Normal rate and regular rhythm.  Pulmonary:     Effort: Pulmonary effort is normal. No respiratory distress.     Breath sounds: No wheezing.  Abdominal:     General: There is no distension.     Palpations: Abdomen is soft.  Musculoskeletal:        General: Normal range of motion.     Cervical back: Normal range of motion and neck supple.  Skin:    General: Skin is warm and  dry.     Findings: No erythema or rash.  Neurological:     Mental Status: He is alert and oriented to person, place, and time.  Psychiatric:        Behavior: Behavior normal.        Thought Content: Thought content normal.        Judgment: Judgment normal.      CBC:    BMET Recent Labs    11/02/20 0028 11/03/20 0113  NA 134* 136  K 3.7 3.5  CL 100 102  CO2 26 28  GLUCOSE 142* 113*  BUN 11 13  CREATININE 1.06 1.06  CALCIUM 8.7* 8.5*     Liver Panel  No results for input(s): PROT, ALBUMIN, AST, ALT, ALKPHOS, BILITOT, BILIDIR, IBILI in the last 72 hours.     Sedimentation Rate No results for input(s): ESRSEDRATE in the last 72 hours. C-Reactive Protein No results for input(s): CRP in the last 72 hours.  Micro Results: Recent Results (from the past 720 hour(s))  Resp Panel by RT-PCR (Flu  A&B, Covid) Nasopharyngeal Swab     Status: None   Collection Time: 10/31/20 11:33 AM   Specimen: Nasopharyngeal Swab; Nasopharyngeal(NP) swabs in vial transport medium  Result Value Ref Range Status   SARS Coronavirus 2 by RT PCR NEGATIVE NEGATIVE Final    Comment: (NOTE) SARS-CoV-2 target nucleic acids are NOT DETECTED.  The SARS-CoV-2 RNA is generally detectable in upper respiratory specimens during the acute phase of infection. The lowest concentration of SARS-CoV-2 viral copies this assay can detect is 138 copies/mL. A negative result does not preclude SARS-Cov-2 infection and should not be used as the sole basis for treatment or other patient management decisions. A negative result may occur with  improper specimen collection/handling, submission of specimen other than nasopharyngeal swab, presence of viral mutation(s) within the areas targeted by this assay, and inadequate number of viral copies(<138 copies/mL). A negative result must be combined with clinical observations, patient history, and epidemiological information. The expected result is Negative.  Fact Sheet for Patients:  BloggerCourse.com  Fact Sheet for Healthcare Providers:  SeriousBroker.it  This test is no t yet approved or cleared by the [place] FDA and  has been authorized for detection and/or diagnosis of SARS-CoV-2 by FDA under an Emergency Use Authorization (EUA). This EUA will remain  in effect (meaning this test can be used) for the duration of the COVID-19 declaration under Section 564(b)(1) of the Act, 21 U.S.C.section 360bbb-3(b)(1), unless the authorization is terminated  or revoked sooner.       Influenza A by PCR NEGATIVE NEGATIVE Final   Influenza B by PCR NEGATIVE NEGATIVE Final    Comment: (NOTE) The Xpert Xpress SARS-CoV-2/FLU/RSV plus assay is intended as an aid in the diagnosis of influenza from Nasopharyngeal swab specimens  and should not be used as a sole basis for treatment. Nasal washings and aspirates are unacceptable for Xpert Xpress SARS-CoV-2/FLU/RSV testing.  Fact Sheet for Patients: BloggerCourse.com  Fact Sheet for Healthcare Providers: SeriousBroker.it  This test is not yet approved or cleared by the [place] FDA and has been authorized for detection and/or diagnosis of SARS-CoV-2 by FDA under an Emergency Use Authorization (EUA). This EUA will remain in effect (meaning this test can be used) for the duration of the COVID-19 declaration under Section 564(b)(1) of the Act, 21 U.S.C. section 360bbb-3(b)(1), unless the authorization is terminated or revoked.  Performed at [facility], [address], [place], [place] [address]  Surgical pcr screen     Status: None   Collection Time: 11/01/20  7:45 AM   Specimen: Nasal Mucosa; Nasal Swab  Result Value Ref Range Status   MRSA, PCR NEGATIVE NEGATIVE Final   Staphylococcus aureus NEGATIVE NEGATIVE Final    Comment: (NOTE) The Xpert SA Assay (FDA approved for NASAL specimens in patients 58 years of age and older), is one component of a comprehensive surveillance program. It is not intended to diagnose infection nor to guide or monitor treatment. Performed at [facility], [address] [place], [place] [address]   Aerobic/Anaerobic Culture w Gram Stain (surgical/deep wound)     Status: None (Preliminary result)   Collection Time: 11/01/20  4:15 PM   Specimen: PATH ENT excision; Tissue  Result Value Ref Range Status   Specimen Description TISSUE  Final   Special Requests LEFT MANDIBLE CYST  Final   Gram Stain   Final    RARE WBC PRESENT, PREDOMINANTLY PMN MODERATE GRAM POSITIVE RODS FEW GRAM POSITIVE COCCI RARE GRAM NEGATIVE RODS    Culture   Final    CULTURE REINCUBATED FOR BETTER GROWTH Performed at [facility], [address] [place], [place] [address]     Report Status PENDING  Incomplete    Studies/Results: [place] EKG SITE RITE  Result Date: 11/02/2020 If [facility] image not attached, placement could not be confirmed due to current cardiac rhythm.     Assessment/Plan:  INTERVAL HISTORY:   Multiple organisms appear to be growing from cultures pathology shows no malignancy   Active Problems:   Osteomyelitis of jaw    Trevor Simmons is a 40 y.o. male with history of wisdom tooth that became infected 17 years ago requiring resection and placement of bone graft treated for nearly a year with antibiotics now with progressive left-sided facial pain now found to have deeper osteomyelitis with a cystic lesion and tract which communicated with the wisdom tooth extraction, status post surgery from Dr. Barbette Merino with excision of the cyst and the mandible debridement with material sent for path and cultures.  [place]  No malignancy seen on pathology.  Continue ampicillin sulbactam for now while in the hospital.  Provided the organisms are susceptible I plan on giving him once a day ertapenem for a month followed by oral antibiotics.  PICC line has been placed.     LOS: 3 days   Trevor Simmons 11/03/2020, 1:53 PM

## 2020-11-04 ENCOUNTER — Other Ambulatory Visit: Payer: Self-pay | Admitting: Student

## 2020-11-04 LAB — CBC
HCT: 36.5 % — ABNORMAL LOW (ref 39.0–52.0)
Hemoglobin: 12.6 g/dL — ABNORMAL LOW (ref 13.0–17.0)
MCH: 28.6 pg (ref 26.0–34.0)
MCHC: 34.5 g/dL (ref 30.0–36.0)
MCV: 83 fL (ref 80.0–100.0)
Platelets: 246 10*3/uL (ref 150–400)
RBC: 4.4 MIL/uL (ref 4.22–5.81)
RDW: 12.2 % (ref 11.5–15.5)
WBC: 7 10*3/uL (ref 4.0–10.5)
nRBC: 0 % (ref 0.0–0.2)

## 2020-11-04 MED ORDER — ERTAPENEM IV (FOR PTA / DISCHARGE USE ONLY): 1.0000 g | INTRAVENOUS | 0 refills | Status: AC

## 2020-11-04 MED ORDER — ONDANSETRON HCL 4 MG/2ML IJ SOLN
4.0000 mg | Freq: Four times a day (QID) | INTRAMUSCULAR | Status: DC | PRN
  Administered 2020-11-04 (×2): 4 mg via INTRAVENOUS
  Filled 2020-11-04 (×2): qty 2

## 2020-11-04 MED ORDER — OXYCODONE-ACETAMINOPHEN 5-325 MG PO TABS: 1.0000 | ORAL_TABLET | ORAL | 0 refills | Status: AC | PRN

## 2020-11-04 MED ORDER — ONDANSETRON 4 MG PO TBDP: 4.0000 mg | ORAL_TABLET | Freq: Three times a day (TID) | ORAL | 0 refills | Status: AC | PRN

## 2020-11-04 MED ORDER — HEPARIN SOD (PORK) LOCK FLUSH 100 UNIT/ML IV SOLN
250.0000 [IU] | INTRAVENOUS | Status: AC | PRN
  Administered 2020-11-04: 250 [IU]
  Filled 2020-11-04: qty 2.5

## 2020-11-04 MED ORDER — POLYETHYLENE GLYCOL 3350 17 G PO PACK: 17.0000 g | PACK | Freq: Every day | ORAL | 0 refills | Status: AC | PRN

## 2020-11-04 MED ORDER — SODIUM CHLORIDE 0.9 % IV SOLN
1.0000 g | INTRAVENOUS | Status: DC
  Administered 2020-11-04: 1000 mg via INTRAVENOUS
  Filled 2020-11-04: qty 1

## 2020-11-04 MED FILL — OXYCODONE-APAP 5-325MG: 5-325 | 7 days supply | Qty: 30 | Fill #0

## 2020-11-04 MED FILL — ONDANSETRON ODT 4 MG TABLET: 4 | 6 days supply | Qty: 20 | Fill #0

## 2020-11-04 NOTE — Discharge Summary (Signed)
Name: Taeveon Keesling MRN: [id number] DOB: Sep 14, 1980 40 y.o. PCP: Gildardo Pounds, NP  Date of Admission: 10/31/2020 12:45 AM Date of Discharge: 11/04/20 Attending Physician: Dr. Dareen Piano  Discharge Diagnosis: Active Problems:   Osteomyelitis of jaw   Facial pain    Discharge Medications: Allergies as of 11/04/2020   No Known Allergies     Medication List    TAKE these medications   acetaminophen 500 MG tablet Commonly known as: TYLENOL Take 500 mg by mouth every 6 (six) hours as needed for moderate pain or headache.   ertapenem  IVPB Commonly known as: INVANZ Inject 1 g into the vein daily for 26 days. Indication:  Mandibular osteomyelitis  First Dose: no Last Day of Therapy:  11/29/2020 Labs - Once weekly:  CBC/D and BMP, Labs - Every other week:  ESR and CRP Method of administration: Mini-Bag Plus / Gravity Method of administration may be changed at the discretion of home infusion pharmacist based upon assessment of the patient and/or caregiver's ability to self-administer the medication ordered.   ondansetron 4 MG disintegrating tablet Commonly known as: Zofran ODT Take 1 tablet (4 mg total) by mouth every 8 (eight) hours as needed for nausea or vomiting.   oxyCODONE-acetaminophen 5-325 MG tablet Commonly known as: PERCOCET/ROXICET Take 1-2 tablets by mouth every 4 (four) hours as needed for up to 7 days for moderate pain.   polyethylene glycol 17 g packet Commonly known as: MIRALAX / GLYCOLAX Take 17 g by mouth daily as needed for mild constipation.            Discharge Care Instructions  (From admission, onward)         Start     Ordered   11/04/20 0000  Change dressing on IV access line weekly and PRN  (Home infusion instructions - [facility] )        11/04/20 1431   11/04/20 0000  Leave dressing on - Keep it clean, dry, and intact until clinic visit        11/04/20 1431          Disposition and follow-up:   Mr.Arnol  Gennaro Lizotte was discharged from [facility] in Stable condition.  At the hospital follow up visit please address:  1.  Follow-up:  a.  Osteomyelitis with cystic lesion-follow-up with ID/oral surgery    b.  Hyperlipidemia-follow-up with PCP  2.  Labs / imaging needed at time of follow-up: None  3.  Pending labs/ test needing follow-up: Anaerobic/aerobic cultures  4.  Medication Changes  Started: Ertapenem, Percocet, Zofran  Stopped: None  Changed: None  Abx -Ertapenem End Date:  Follow-up Appointments:  Follow-up [place] Follow up.   Why: Dec 28, 2020 at [place] information: [address] [place] [address] [phone]       Schedule an appointment as soon as possible for a visit with Diona Browner, DMD.   Specialty: Oral Surgery Why: For wound re-check Contact information: [place] [address] [phone]        Care, [facility] Follow up.   Specialty: Home Health Services Contact information: [address] [place] [address] [phone]        [facility]              Follow up.   Contact information: [place]  [facility] [address]              Hospital Course by problem list:  Excision cyst, debridement with bone graft left mandible Patient patient presented to ED with left-sided facial swelling and pain.  He states that 17 years ago he had an infected left-sided lower wisdom tooth.  At that time the patient was offered surgery but declined and this was treated with antibiotics.  Since then he denies any issues with this area.  He was able to tolerate his own secretions/liquids.  CT scan as well as MRI revealed chronic abnormal sclerosis of left mandibular ramus superimposed acute inflammation with associated musculature as well as soft tissue tract  within the bone communicating to the left wisdom tooth extraction site.  Oral surgery was then consulted and took patient to the OR the following day.  He had exploration of left mandible, excision of cyst debridement of the tract from left subcondylar cyst to third molar area, with bone grafting.  Mandible debridement material was sent for pathology and cultures.  During his hospitalization he was started on Unasyn and continued on this until he was discharged.  Organisms were susceptible to ertapenem.  A PICC was placed, patient was to continue with ertapenem for 1 month and follow-up with ID on outpatient basis.  Patient also instructed to follow-up with oral surgery as well.  Tolerated the procedure well without any complications.  His pain was well controlled during his hospitalization.  He was discharged home with 7 days of Percocets.   Discharge Subjective: Patient doing well and states pain is well controlled.  He continued to have some nausea that improved with Zofran.  Patient's wife at bedside.  Discussed with patient that he should limit working due to having PICC in place.  Area should not get dirty, wet, he has to keep the area clean and dry.  Also discussed with him that he will need follow-up with ID and oral surgery.  He denied any difficulty with eating or drinking.  Discharge Exam:   BP 111/63 (BP Location: Left Arm)   Pulse 74   Temp 98.4 F (36.9 C) (Oral)   Resp 18   Ht 5' 10" (1.778 m)   Wt 72.6 kg   SpO2 98%   BMI 22.96 kg/m  Constitutional: well-appearing, sitting upright in bed. HENT: normocephalic atraumatic, swelling improved Eyes: conjunctiva non-erythematous Neck: supple Cardiovascular: regular rate and rhythm, no m/r/g Pulmonary/Chest: normal work of breathing on room air, lungs clear to auscultation bilaterally Abdominal: soft, non-tender, non-distended MSK: normal bulk and tone Neurological: alert & oriented x 3, 5/5 strength in bilateral upper and lower  extremities, normal gait Skin: warm and dry Psych: Normal mood  Pertinent Labs, Studies, and Procedures:  CBC Latest Ref Rng & Units 11/04/2020 11/03/2020 11/02/2020  WBC 4.0 - 10.5 K/uL 7.0 9.8 13.6(H)  Hemoglobin 13.0 - 17.0 g/dL 12.6(L) 12.8(L) 13.7  Hematocrit 39.0 - 52.0 % 36.5(L) 36.9(L) 39.8  Platelets 150 - 400 K/uL 246 235 250    CMP Latest Ref Rng & Units 11/03/2020 11/02/2020 11/01/2020  Glucose 70 - 99 mg/dL 113(H) 142(H) 105(H)  BUN 6 - 20 mg/dL _0 Creatinine 0.61 - 1.24 mg/dL 1.06 1.06 1.04  Sodium 135 - 145 mmol/L 136 134(L) 136  Potassium 3.5 - 5.1 mmol/L 3.5 3.7 3.8  Chloride 98 - 111 mmol/L 102 100 103  CO2 22 - 32 mmol/L _1 Calcium 8.9 - 10.3 mg/dL 8.5(L) 8.7(L)  9.0  Total Protein 6.5 - 8.1 g/dL - - -  Total Bilirubin 0.3 - 1.2 mg/dL - - -  Alkaline Phos 38 - 126 U/L - - -  AST 15 - 41 U/L - - -  ALT 0 - 44 U/L - - -    CT Maxillofacial W Contrast  Result Date: 10/31/2020 CLINICAL DATA:  40 year old male with facial pain. Pain and swelling anterior to the left ear. Denies fever. EXAM: CT MAXILLOFACIAL WITH CONTRAST TECHNIQUE: Multidetector CT imaging of the maxillofacial structures was performed with intravenous contrast. Multiplanar CT image reconstructions were also generated. CONTRAST:  71m OMNIPAQUE IOHEXOL 300 MG/ML  SOLN COMPARISON:  None. FINDINGS: Osseous: Benign-appearing hypertrophic changes to the left lateral maxillary alveolar process at the level of the left maxillary molars on series 5, image 46. Superimposed mildly enlarged and sclerotic left mandible ramus (series 9, image 55, series 5 images 31 through 41) and there is a circumscribed rounded area of bone remodeling along the medial subcondylar region (series 5, image 51) where hypodense area with relatively simple fluid density is present and seems to communicate with the left mandible wisdom tooth extraction site via an expanded bony canal (series 3, image 43). This seems to be separate  separate from although in proximity to the course for the left inferior alveolar nerve. Left TMJ remains located and seems unaffected. No periosteal reaction. No left masticator space inflammation is evident. Contralateral right mandible wisdom tooth is still in place. No other significant dental finding identified. The remaining mandible appears normal. Bone mineralization elsewhere appears normal. Other facial bones intact. Visible central skull base and cervical vertebrae appear intact. Orbits: Intact orbital walls. Symmetric and negative orbits soft tissues. Sinuses: Clear. Soft tissues: Marked area of clinical concern is the left lateral face near the zygoma. No discrete soft tissue mass or abnormality in that region. Nearby left parotid space and left masticator spaces appear symmetric and within normal limits, although see left mandible abnormality detailed above. No Pena swelling. Negative visible larynx, pharynx, parapharyngeal spaces, retropharyngeal space, sublingual space, submandibular spaces. Major vascular structures in the visible neck and at the skull base appear patent and normal. No upper cervical lymphadenopathy. Limited intracranial: Negative. IMPRESSION: 1. No soft tissue space abnormality is identified, but deep to the marked area of clinical concern there is an abnormally expanded and sclerotic appearance of the left mandible ramus. Although nonspecific, I suspect this may be a post infectious sequelae such as chronic osteomyelitis related to extracted left mandible wisdom tooth - as a small 10 mm round cystic area which has smoothly remodeled the medial ramus appears to communicate with the wisdom tooth extraction site. A primary osteogenic tumor of the left mandible is also a consideration but felt less likely. A follow-up Face MRI without and with contrast would be helpful to further characterize and exclude the possibility of active osteomyelitis. 2. The mandible elsewhere appears  negative. Negative face CT otherwise. Electronically Signed   By: HGenevie AnnM.D.   On: 10/31/2020 05:50   MR FACE/TRIGEMINAL WO/W CM  Result Date: 10/31/2020 CLINICAL DATA:  40year old male with facial pain, reports pain and swelling anterior to the left ear. Abnormal left mandible ramus on face CT with contrast earlier today. EXAM: MRI FACE TRIGEMINAL WITHOUT AND WITH CONTRAST TECHNIQUE: Multiplanar, multiecho pulse sequences of the face and surrounding structures, including thin slice imaging of the course of the Trigeminal Nerves, were obtained both before and after administration of intravenous contrast. CONTRAST:  748m  GADAVIST GADOBUTROL 1 MMOL/ML IV SOLN COMPARISON:  Face CT with contrast today. Also head CT 11/12/2019. FINDINGS: Visible pharynx, parapharyngeal spaces, retropharyngeal space, and all the visible soft tissue spaces of the right face and neck are negative. There is only trace paranasal sinus mucosal thickening. Orbits are negative. Major intracranial vascular flow voids are preserved. Negative visible brain parenchyma. Furthermore, the left stylomastoid foramen is normal. Visible internal auditory structures appear normal. Mastoids are clear. Visible bone marrow signal is normal except in the left mandible beginning at the left mandible wisdom tooth extraction site. The left mandible ramus is abnormally sclerotic and enlarged (series 11, image 13) with a linear internal intermediate signal T1 and T2 channel tracking from the tooth extraction site to a mildly complex appearing oval 18 mm cyst which has chronically scalloped the medial subcondylar region. And in addition to the CT face findings earlier today, prior head CT from March of last year also demonstrates the abnormal subcondylar changes although the cyst and scalloping appear enlarged from that time (11/12/2019 series 4, image 1). Following contrast there is no enhancement of the sclerotic mandible. But both the linear abnormal  intra-osseous tract, and the majority of the surrounding left muscles of mastication do abnormally enhance (see series 14, image 13). Abnormal enhancement of the intra osseous tract continues to the wisdom tooth extraction site as seen on series 13, image 1. There is associated moderate to severe increased T2 and STIR hyperintensity within the left masseter muscle (series 12, image 7). The cyst in the subcondylar region which remodels the bone demonstrates a thin rim of enhancement superiorly and medially (series 13, image 12). The nearby left TMJ seems largely unaffected with no joint fluid. IMPRESSION: 1. 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 Also a chronic but enlarged cystic lesion of the medial subcondylar mandible associated with that tract has complex fluid contents and partially rim enhances also. 2. 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 Sampling of the tooth extraction site might be the next best step. 3. The face elsewhere, the left middle ear, and the visible brain appear normal. Study discussed by telephone with PA Silverio Decamp in the ED at 0856 hours on 10/31/2020. Electronically Signed   By: Genevie Ann M.D.   On: 10/31/2020 09:06     Discharge Instructions: Discharge Instructions    [facility] pharmacist to adjust dose for Vancomycin, Aminoglycosides and other anti-infective therapies as requested by physician.   Complete by: As directed    [facility] to provide Cath Flo 52m   Complete by: As directed    Administer for PICC line occlusion and as  ordered by physician for other access device issues.   Anaphylaxis Kit: Provided to treat any anaphylactic reaction to the medication being provided to the patient if First Dose or when requested by physician   Complete by: As directed    Epinephrine 148mml vial / amp: Administer 0.64m71m0.64ml864mubcutaneously once for moderate to severe anaphylaxis, nurse to call physician and pharmacy when reaction occurs and call 911 if needed for immediate care   Diphenhydramine 50mg15mIV vial: Administer 25-50mg31m  IV/IM PRN for first dose reaction, rash, itching, mild reaction, nurse to call physician and pharmacy when reaction occurs   Sodium Chloride 0.9% NS 553m IV: Administer if needed for hypovolemic blood pressure drop or as ordered by physician after call to physician with anaphylactic reaction   Call MD for:  difficulty breathing, headache or visual disturbances   Complete by: As directed    Call MD for:  hives   Complete by: As directed    Call MD for:  persistant dizziness or light-headedness   Complete by: As directed    Call MD for:  persistant nausea and vomiting   Complete by: As directed    Call MD for:  redness, tenderness, or signs of infection (pain, swelling, redness, odor or green/yellow discharge around incision site)   Complete by: As directed    Call MD for:  severe uncontrolled pain   Complete by: As directed    Call MD for:  temperature >100.4   Complete by: As directed    Change dressing on IV access line weekly and PRN   Complete by: As directed    Diet - low sodium heart healthy   Complete by: As directed    Discharge instructions   Complete by: As directed    Gracias por permitirnos cuidar de usted durante su hospitalizacin. Se encuentra que tiene una infeccin. Gracias por permitirnos cuidarlo durante su hospitalizacin. Se le encuentra infeccin y aSaint Hel[place]en la mandbula inferior izquierda y se abre en la mandbula izquierda. Fuiste a cLibyan Arab Jamahiriyay te limpiaron esto y te tomaron  biopsias. Las biopsias fueron negativas para cualquier signo de malignidad. Continuar con antibiticos intravenosos durante 1 mes y en ese momento se cambiar a antibiticos orales. Haga un seguimiento con salud y bTherapist, sportsde la comunidad, as como con los mdicos de enfermedades infecciosas. Le hemos escrito para 7 das de medicacin para eConservation officer, historic buildings Alterna entre estos y Tylenol. Si nota que el dolor empeora con la hinchazn, llame a su mdico.   Flush IV access with Sodium Chloride 0.9% and Heparin 10 units/ml or 100 units/ml   Complete by: As directed    Home infusion instructions - [facility]   Complete by: As directed    Instructions: Flush IV access with Sodium Chloride 0.9% and Heparin 10units/ml or 100units/ml   Change dressing on IV access line: Weekly and PRN   Instructions Cath Flo 257m Administer for PICC Line occlusion and as ordered by physician for other access device   [facility] pharmacist to adjust dose for: Vancomycin, Aminoglycosides and other anti-infective therapies as requested by physician   Increase activity slowly   Complete by: As directed    Leave dressing on - Keep it clean, dry, and intact until clinic visit   Complete by: As directed    Method of administration may be changed at the discretion of home infusion pharmacist based upon assessment of the patient and/or caregiver's ability to self-administer the medication ordered   Complete by: As directed       Signed: KaRiesa PopeMD 11/04/2020, 5:37 PM   Pager: [phone]

## 2020-11-04 NOTE — Care Management (Signed)
Pam with [facility] went back to room to see wife. Wife was not in room. Pam answered patient's questions. Per patient wife has not watched video. Pam encouraged patient to tell wife to watch video.   Ronny Flurry RN

## 2020-11-04 NOTE — Progress Notes (Addendum)
Subjective: No new complaints   Antibiotics:  Anti-infectives (From admission, onward)   Start     Dose/Rate Route Frequency Ordered Stop   11/04/20 1500  ertapenem (INVANZ) 1,000 mg in sodium chloride 0.9 % 100 mL IVPB        1 g 200 mL/hr over 30 Minutes Intravenous Every 24 hours 11/04/20 1036     11/01/20 1700  ceFEPIme (MAXIPIME) 2 g in sodium chloride 0.9 % 100 mL IVPB  Status:  Discontinued        2 g 200 mL/hr over 30 Minutes Intravenous Every 8 hours 11/01/20 0814 11/01/20 0828   11/01/20 1000  vancomycin (VANCOREADY) IVPB 750 mg/150 mL  Status:  Discontinued        750 mg 150 mL/hr over 60 Minutes Intravenous Every 8 hours 11/01/20 0831 11/02/20 0924   11/01/20 0900  ceFEPIme (MAXIPIME) 2 g in sodium chloride 0.9 % 100 mL IVPB  Status:  Discontinued        2 g 200 mL/hr over 30 Minutes Intravenous  Once 11/01/20 0814 11/01/20 0828   11/01/20 0900  Ampicillin-Sulbactam (UNASYN) 3 g in sodium chloride 0.9 % 100 mL IVPB  Status:  Discontinued        3 g 200 mL/hr over 30 Minutes Intravenous Every 6 hours 11/01/20 0828 11/04/20 1036   10/31/20 1800  vancomycin (VANCOREADY) IVPB 1000 mg/200 mL  Status:  Discontinued        1,000 mg 200 mL/hr over 60 Minutes Intravenous Every 8 hours 10/31/20 0933 11/01/20 0831   10/31/20 0945  vancomycin (VANCOREADY) IVPB 1500 mg/300 mL        1,500 mg 150 mL/hr over 120 Minutes Intravenous  Once 10/31/20 0932 10/31/20 1236   10/31/20 0915  piperacillin-tazobactam (ZOSYN) IVPB 3.375 g        3.375 g 100 mL/hr over 30 Minutes Intravenous  Once 10/31/20 0906 10/31/20 1000      Medications: Scheduled Meds: . Chlorhexidine Gluconate Cloth  6 each Topical Daily  . heparin  5,000 Units Subcutaneous Q8H  . sodium chloride flush  10-40 mL Intracatheter Q12H   Continuous Infusions: . dextrose 5 % and 0.45% NaCl 75 mL/hr at 11/04/20 0221  . ertapenem     PRN Meds:.acetaminophen **OR** acetaminophen, ondansetron (ZOFRAN) IV,  oxyCODONE-acetaminophen, polyethylene glycol, sodium chloride flush, white petrolatum    Objective: Weight change:   Intake/Output Summary (Last 24 hours) at 11/04/2020 1153 Last data filed at 11/03/2020 2300 Gross per 24 hour  Intake 240 ml  Output --  Net 240 ml   Blood pressure 124/69, pulse 60, temperature 98.2 F (36.8 C), temperature source Oral, resp. rate 16, height '5\' 10"'  (1.778 m), weight 72.6 kg, SpO2 100 %. Temp:  [98.2 F (36.8 C)-98.6 F (37 C)] 98.2 F (36.8 C) (03/24 0505) Pulse Rate:  [55-76] 60 (03/24 0505) Resp:  [16-19] 16 (03/24 0505) BP: (111-131)/(64-93) 124/69 (03/24 0505) SpO2:  [98 %-100 %] 100 % (03/24 0505)  Physical Exam: Physical Exam Constitutional:      Appearance: He is well-developed.  HENT:     Head: Normocephalic and atraumatic.   Eyes:     Conjunctiva/sclera: Conjunctivae normal.  Cardiovascular:     Rate and Rhythm: Normal rate and regular rhythm.  Pulmonary:     Effort: Pulmonary effort is normal. No respiratory distress.     Breath sounds: No wheezing.  Abdominal:     General: There is no distension.  Palpations: Abdomen is soft.  Musculoskeletal:        General: Normal range of motion.     Cervical back: Normal range of motion and neck supple.  Skin:    General: Skin is warm and dry.     Findings: No erythema or rash.  Neurological:     General: No focal deficit present.     Mental Status: He is alert and oriented to person, place, and time.  Psychiatric:        Mood and Affect: Mood normal.        Behavior: Behavior normal.        Thought Content: Thought content normal.        Judgment: Judgment normal.      CBC:    BMET Recent Labs    11/02/20 0028 11/03/20 0113  NA 134* 136  K 3.7 3.5  CL 100 102  CO2 26 28  GLUCOSE 142* 113*  BUN 11 13  CREATININE 1.06 1.06  CALCIUM 8.7* 8.5*     Liver Panel  No results for input(s): PROT, ALBUMIN, AST, ALT, ALKPHOS, BILITOT, BILIDIR, IBILI in the last 72  hours.     Sedimentation Rate No results for input(s): ESRSEDRATE in the last 72 hours. C-Reactive Protein No results for input(s): CRP in the last 72 hours.  Micro Results: Recent Results (from the past 720 hour(s))  Resp Panel by RT-PCR (Flu A&B, Covid) Nasopharyngeal Swab     Status: None   Collection Time: 10/31/20 11:33 AM   Specimen: Nasopharyngeal Swab; Nasopharyngeal(NP) swabs in vial transport medium  Result Value Ref Range Status   SARS Coronavirus 2 by RT PCR NEGATIVE NEGATIVE Final    Comment: (NOTE) SARS-CoV-2 target nucleic acids are NOT DETECTED.  The SARS-CoV-2 RNA is generally detectable in upper respiratory specimens during the acute phase of infection. The lowest concentration of SARS-CoV-2 viral copies this assay can detect is 138 copies/mL. A negative result does not preclude SARS-Cov-2 infection and should not be used as the sole basis for treatment or other patient management decisions. A negative result may occur with  improper specimen collection/handling, submission of specimen other than nasopharyngeal swab, presence of viral mutation(s) within the areas targeted by this assay, and inadequate number of viral copies(<138 copies/mL). A negative result must be combined with clinical observations, patient history, and epidemiological information. The expected result is Negative.  Fact Sheet for Patients:  EntrepreneurPulse.com.au  Fact Sheet for Healthcare Providers:  IncredibleEmployment.be  This test is no t yet approved or cleared by the [place] FDA and  has been authorized for detection and/or diagnosis of SARS-CoV-2 by FDA under an Emergency Use Authorization (EUA). This EUA will remain  in effect (meaning this test can be used) for the duration of the COVID-19 declaration under Section 564(b)(1) of the Act, 21 U.S.C.section 360bbb-3(b)(1), unless the authorization is terminated  or revoked sooner.        Influenza A by PCR NEGATIVE NEGATIVE Final   Influenza B by PCR NEGATIVE NEGATIVE Final    Comment: (NOTE) The Xpert Xpress SARS-CoV-2/FLU/RSV plus assay is intended as an aid in the diagnosis of influenza from Nasopharyngeal swab specimens and should not be used as a sole basis for treatment. Nasal washings and aspirates are unacceptable for Xpert Xpress SARS-CoV-2/FLU/RSV testing.  Fact Sheet for Patients: EntrepreneurPulse.com.au  Fact Sheet for Healthcare Providers: IncredibleEmployment.be  This test is not yet approved or cleared by the [place] FDA and has been authorized for detection  and/or diagnosis of SARS-CoV-2 by FDA under an Emergency Use Authorization (EUA). This EUA will remain in effect (meaning this test can be used) for the duration of the COVID-19 declaration under Section 564(b)(1) of the Act, 21 U.S.C. section 360bbb-3(b)(1), unless the authorization is terminated or revoked.  Performed at [facility], [place] [address], [place], [place] [address]   Surgical pcr screen     Status: None   Collection Time: 11/01/20  7:45 AM   Specimen: Nasal Mucosa; Nasal Swab  Result Value Ref Range Status   MRSA, PCR NEGATIVE NEGATIVE Final   Staphylococcus aureus NEGATIVE NEGATIVE Final    Comment: (NOTE) The Xpert SA Assay (FDA approved for NASAL specimens in patients 35 years of age and older), is one component of a comprehensive surveillance program. It is not intended to diagnose infection nor to guide or monitor treatment. Performed at [facility], [place] [address], [place], [place] [address]   Aerobic/Anaerobic Culture w Gram Stain (surgical/deep wound)     Status: None (Preliminary result)   Collection Time: 11/01/20  4:15 PM   Specimen: PATH ENT excision; Tissue  Result Value Ref Range Status   Specimen Description TISSUE  Final   Special Requests LEFT MANDIBLE CYST  Final   Gram Stain   Final     RARE WBC PRESENT, PREDOMINANTLY PMN MODERATE GRAM POSITIVE RODS FEW GRAM POSITIVE COCCI RARE GRAM NEGATIVE RODS    Culture   Final    FEW SUSCEPTIBILITIES TO FOLLOW Beta hemolytic streptococci are predictably susceptible to penicillin and other beta lactams. Susceptibility testing not routinely performed. Performed at [facility], [place] [address] [place], [place] [address]    Report Status PENDING  Incomplete    Studies/Results: [place] EKG SITE RITE  Result Date: 11/02/2020 If [facility] image not attached, placement could not be confirmed due to current cardiac rhythm.     Assessment/Plan:  INTERVAL HISTORY:   BETA-HEMOLYTIC STREPTOCOCCI growing from cultures  Active Problems:   Osteomyelitis of jaw   Facial pain    Trevor Simmons is a 40 y.o. male with history of wisdom tooth that became infected 17 years ago requiring resection and placement of bone graft treated for nearly a year with antibiotics now with progressive left-sided facial pain now found to have deeper osteomyelitis with a cystic lesion and tract which communicated with the wisdom tooth extraction, status post surgery from Dr. Hoyt Koch with excision of the cyst and the mandible debridement with material sent for path and cultures.  [place]  No malignancy seen on pathology.  Which to IV Invanz and will give him a 4-week course followed by oral antibiotics Intravenous fluids were administered,   Diagnosis: Osteomyelitis of jaw Culture Result: Beta-hemolytic streptococci  No Known Allergies  OPAT Orders Discharge antibiotics:  Ertapenem  Duration:  4 weeks End Date:  November 29, 2020  [facility] Care Per Protocol:    Labs  weekly while on IV antibiotics: _x_ CBC with differential _x_ BMP w GFR/CMP _x_ CRP _x_ ESR    x__ Please pull PIC at completion of IV antibiotics __ Please leave PIC in place until doctor has seen patient or been notified  Fax weekly labs to [phone]  Clinic  Follow Up Appt:   Trevor Simmons has an appointment on November 16, 2020 at 2:45 PM with Dr. Tommy Medal  The [facility] is located in the [facility] at  [address] in [place].  [address], which is located to the left of the elevators.  Phone: [phone]  Fax: [phone]  [email or web address]  He should arrive 15 minutes prior to his appointment.   I spent greater than 35 minutes with the patient including greater than 50% of time in face to face counsel of the patient and his wife and in coordination of his care with the primary team and home infusion company and pharmacy.      LOS: 4 days   Trevor Simmons 11/04/2020, 11:53 AM

## 2020-11-04 NOTE — Progress Notes (Signed)
Used interpreter to help explain d/c instructions.Pt and wife understands all d/c instructions and have no additional questions at this time.  Pt has all belongings. Pt d/c via wheelchair by RN.

## 2020-11-04 NOTE — TOC Progression Note (Signed)
Transition of Care [facility]) - Progression Note    Patient Details  Name: Abijah Roussel MRN: [id number] Date of Birth: [birth year]  Transition of Care [facility]) CM/SW Contact  Nadene Rubins Adria Devon, RN Phone Number: 11/04/2020, 10:22 AM  Clinical Narrative:     Elita Quick with [facility] And interpreter Ashby Dawes provided 1.5 hour teach. Patient did we1l and [facility] will see for start of care Saturday   Expected Discharge Plan: Home w Home Health Services Barriers to Discharge: Continued Medical Work up  Expected Discharge Plan and Services Expected Discharge Plan: Home w Home Health Services   Discharge Planning Services: CM Consult Post Acute Care Choice: Home Health Living arrangements for the past 2 months: Single Family Home                 DME Arranged: N/A DME Agency: NA       HH Arranged: RN HH Agency: [facility] Date HH Agency Contacted: 11/03/20 Time HH Agency Contacted: [phone] Representative spoke with at [facility] Agency: Kandee Keen   Social Determinants of Health (SDOH) Interventions    Readmission Risk Interventions No flowsheet data found.

## 2020-11-04 NOTE — Progress Notes (Signed)
Pt's wife asked if she could get teaching on pt's PICC line. Pt received teaching on this earlier this morning but the wife was not present and is just nervous about using the PICC line. Informed wife that I would see if Pam with [facility] could come back by.

## 2020-11-04 NOTE — Progress Notes (Signed)
Trevor Simmons PROGRESS NOTE:   SUBJECTIVE: C/o nausea. Patient also concerned with hard, painless swelling upper left gums.  OBJECTIVE:  Vitals: Blood pressure 111/63, pulse 74, temperature 98.4 F (36.9 C), temperature source Oral, resp. rate 18, height 5\' 10"  (1.778 m), weight 72.6 kg, SpO2 98 %. Lab results: Results for orders placed or performed during the hospital encounter of 10/31/20 (from the past 24 hour(s))  CBC     Status: Abnormal   Collection Time: 11/04/20  3:30 AM  Result Value Ref Range   WBC 7.0 4.0 - 10.5 K/uL   RBC 4.40 4.22 - 5.81 MIL/uL   Hemoglobin 12.6 (L) 13.0 - 17.0 g/dL   HCT 11/06/20 (L) 56.4 - 33.2 %   MCV 83.0 80.0 - 100.0 fL   MCH 28.6 26.0 - 34.0 pg   MCHC 34.5 30.0 - 36.0 g/dL   RDW 95.1 88.4 - 16.6 %   Platelets 246 150 - 400 K/uL   nRBC 0.0 0.0 - 0.2 %   Radiology Results: 06.3 EKG SITE RITE  Result Date: 11/02/2020 If Site Rite image not attached, placement could not be confirmed due to current cardiac rhythm.  General appearance: cooperative and no distress Head: Normocephalic, without obvious abnormality, atraumatic Eyes: negative Nose: Nares normal. Septum midline. Mucosa normal. No drainage or sinus tenderness. Throat: 1 cm x 1 cm exostosis left maxilla premolar area. Surgical area without edema or purulence. Pharyx clear. Neck: Non-tender  ASSESSMENT: Healing well from surgery 11/01/2020. Bony area on maxilla noted pre-op and is a benign exostosis. No treatment indicated unless patient needs dentures or partial dentures in the future.  PLAN: Per hospitalist. Will see in office next week.    11/03/2020 11/04/2020

## 2020-11-05 ENCOUNTER — Telehealth: Payer: Self-pay

## 2020-11-05 ENCOUNTER — Telehealth: Payer: Self-pay | Admitting: Nurse Practitioner

## 2020-11-05 LAB — AEROBIC/ANAEROBIC CULTURE W GRAM STAIN (SURGICAL/DEEP WOUND)

## 2020-11-05 NOTE — Telephone Encounter (Signed)
Transition Care Management Follow-up Telephone Call  Date of discharge and from where: [facility] 11/04/2020 How have you been since you were released from the hospital? Good  Any questions or concerns? No questions/concerns reported . Stated that  his wife has been taken care of him.   Items Reviewed:  Did the pt receive and understand the discharge instructions provided? Stated that he has the instructions and have no questions.  Medications obtained and verified? Pt said that he had the medication reviewed prior to discharge. She said that he has all of the medications and they have no questions.  Any new allergies since your discharge? None reported  Do you have support at home? Yes, spouse Other (ie: DME, Home Health, etc)   Pt was discharged with HHS nurse / nurse already reached out to patient for home infusion therapy  Functional Questionnaire: (I = Independent and D = Dependent) ADL's:  Independent.     Follow up appointments reviewed:   PCP Hospital f/u appt confirmed? NP [name] on 4 /06/2021 @ 1050.  Specialist Hospital f/u appt confirmed? Scheduled at this time  Are transportation arrangements needed?Pt has  transportation   If their condition worsens, is the pt aware to call their PCP or go to the ED? Yes. Made pt aware if condition worsen or start experiencing fevers, rapid weight gain, chest pain, diff breathing, SOB, or bleeding to refer imediately to ED for further evaluation.  Was the patient provided with contact information for the PCP's office or ED? He has the phone number of the health center .  Was the pt encouraged to call back with questions or concerns? yes

## 2020-11-05 NOTE — Telephone Encounter (Unsigned)
Copied from CRM [id number]. Topic: Quick Communication - Home Health Verbal Orders >> Nov 05, 2020 11:11 AM Marylen Ponto wrote: Caller/Agency: Diane with [facility] Number: [phone]  Requesting OT/PT/Skilled Nursing/Social Work/Speech Therapy: nursing  Frequency: 1 time a week for 7 weeks

## 2020-11-08 ENCOUNTER — Encounter: Payer: Self-pay | Admitting: Infectious Disease

## 2020-11-08 ENCOUNTER — Telehealth: Payer: Self-pay

## 2020-11-08 NOTE — Telephone Encounter (Signed)
Returned Diane call to giver verbal order left a detailed vm giving verbals

## 2020-11-08 NOTE — Telephone Encounter (Signed)
Thanks Diminique. I hope he is also going to see a PCP or someone who can provide pain control. If not that will be high on the to do list on 29th

## 2020-11-08 NOTE — Telephone Encounter (Signed)
Diane with [facility] called in regards to the patient requesting a sooner appointment. She stated patient is having some difficultly eating a drinking due to the jaw pain. She reported the patient states he feels fine and currently taking pain medication for his jaw. I was able to schedule the patient for a sooner appointment with Daiva Eves on 11/09/20. Patient aware of appointment date, time and location. Patient verbalized  Understanding.  Alora Gorey T Pricilla Loveless

## 2020-11-08 NOTE — Telephone Encounter (Signed)
MA called and LVM with Diane to provide VO.

## 2020-11-09 ENCOUNTER — Ambulatory Visit (INDEPENDENT_AMBULATORY_CARE_PROVIDER_SITE_OTHER): Payer: Self-pay | Admitting: Infectious Disease

## 2020-11-09 ENCOUNTER — Ambulatory Visit: Payer: Self-pay | Admitting: Infectious Disease

## 2020-11-09 ENCOUNTER — Other Ambulatory Visit: Payer: Self-pay

## 2020-11-09 ENCOUNTER — Telehealth: Payer: Self-pay

## 2020-11-09 ENCOUNTER — Encounter: Payer: Self-pay | Admitting: Infectious Disease

## 2020-11-09 ENCOUNTER — Other Ambulatory Visit: Payer: Self-pay | Admitting: Infectious Disease

## 2020-11-09 VITALS — BP 111/72 | HR 67 | Temp 97.9°F

## 2020-11-09 DIAGNOSIS — R519 Headache, unspecified: Secondary | ICD-10-CM

## 2020-11-09 DIAGNOSIS — M272 Inflammatory conditions of jaws: Secondary | ICD-10-CM

## 2020-11-09 MED ORDER — AMOXICILLIN-POT CLAVULANATE 875-125 MG PO TABS: 1.0000 | ORAL_TABLET | Freq: Two times a day (BID) | ORAL | 5 refills | Status: DC

## 2020-11-09 NOTE — Telephone Encounter (Signed)
RN spoke with [facility] at [facility] to request that pharmacy sends more Invanz to patient's home. Per patient's wife, they only have three doses left. Per Amy, they will send more medication today which should hopefully arrive tomorrow.   Sandie Ano, RN

## 2020-11-09 NOTE — Progress Notes (Signed)
Subjective:  Chief complaint follow-up for osteomyelitis of the mandible with jaw pain and yesterday quite a bit of fatigue  Patient ID: Trevor Simmons, male    DOB: 11-14-1980, 40 y.o.   MRN: [id number]  HPI   Mr Chiang is a 40 year old [place] man who 17 years ago had an infected wisdom tooth that had to be removed with placement of bone graft and was treated with nearly a year of antibiotics.  He presented in March with acute onset of left-sided facial pain and was found on imaging to have osteomyelitis with a cystic lesion in tract that communicated with the wisdom tooth extraction site.  He underwent surgery with Dr. Hoyt Koch with excision of the cyst and debridement of the mandible with cultures showing group F Streptococcus.  He is currently on IV ertapenem through advanced home care and set to complete antibiotics in April at which point time he will need to transition over to oral antibiotics which I would like to be in the form of Augmentin.  He originally had an appointment scheduled for me later but yesterday called with concerns that he was feeling weak and not eating.  Today he tells me he feels much improved and his pain is dramatically increased he is using needing much less pain medications that he was in the hospital.  He is accompanied by his wife.  Indeed the swelling in his face is gone down dramatically though he still has difficulty opening his mouth.  He asked when the sutures will be removed from his jaw.    No past medical history on file.  Past Surgical History:  Procedure Laterality Date  . APPENDECTOMY    . I & D EXTREMITY Right 08/19/2017   Procedure: IRRIGATION AND DEBRIDEMENT SMALL FINGER;  Surgeon: Leanora Cover, MD;  Location: [facility];  Service: Orthopedics;  Laterality: Right;  . INCISION AND DRAINAGE ABSCESS Left 11/01/2020   Procedure: INCISION AND DRAINAGE ABSCESS POSSIBLE CYST REMOVAL MANDIBLE;  Surgeon: Diona Browner, DMD;  Location: [facility];  Service:  Oral Surgery;  Laterality: Left;  . WISDOM TOOTH EXTRACTION      Family History  Problem Relation Age of Onset  . Diabetes Neg Hx   . Hypertension Neg Hx       Social History   Socioeconomic History  . Marital status: Married    Spouse name: Not on file  . Number of children: Not on file  . Years of education: Not on file  . Highest education level: Not on file  Occupational History  . Not on file  Tobacco Use  . Smoking status: Never Smoker  . Smokeless tobacco: Never Used  Vaping Use  . Vaping Use: Never used  Substance and Sexual Activity  . Alcohol use: No  . Drug use: No  . Sexual activity: Not Currently  Other Topics Concern  . Not on file  Social History Narrative  . Not on file   Social Determinants of Health   Financial Resource Strain: Not on file  Food Insecurity: Not on file  Transportation Needs: Not on file  Physical Activity: Not on file  Stress: Not on file  Social Connections: Not on file    No Known Allergies   Current Outpatient Medications:  .  amoxicillin-clavulanate (AUGMENTIN) 875-125 MG tablet, Take 1 tablet by mouth 2 (two) times daily. IN Spanish this is to be started AFTER IV antibiotics are complete, Disp: 60 tablet, Rfl: 5 .  ertapenem (INVANZ) IVPB, Inject 1  g into the vein daily for 26 days. Indication:  Mandibular osteomyelitis  First Dose: no Last Day of Therapy:  11/29/2020 Labs - Once weekly:  CBC/D and BMP, Labs - Every other week:  ESR and CRP Method of administration: Mini-Bag Plus / Gravity Method of administration may be changed at the discretion of home infusion pharmacist based upon assessment of the patient and/or caregiver's ability to self-administer the medication ordered., Disp: 26 Units, Rfl: 0 .  ondansetron (ZOFRAN ODT) 4 MG disintegrating tablet, Take 1 tablet (4 mg total) by mouth every 8 (eight) hours as needed for nausea or vomiting., Disp: 20 tablet, Rfl: 0 .  oxyCODONE-acetaminophen (PERCOCET/ROXICET) 5-325 MG  tablet, Take 1-2 tablets by mouth every 4 (four) hours as needed for up to 7 days for moderate pain., Disp: 30 tablet, Rfl: 0 .  acetaminophen (TYLENOL) 500 MG tablet, Take 500 mg by mouth every 6 (six) hours as needed for moderate pain or headache. (Patient not taking: Reported on 11/09/2020), Disp: , Rfl:  .  polyethylene glycol (MIRALAX / GLYCOLAX) 17 g packet, Take 17 g by mouth daily as needed for mild constipation. (Patient not taking: Reported on 11/09/2020), Disp: 14 each, Rfl: 0   Review of Systems  Constitutional: Negative for activity change, appetite change, chills, diaphoresis, fatigue, fever and unexpected weight change.  HENT: Positive for dental problem. Negative for congestion, rhinorrhea, sinus pressure, sneezing, sore throat and trouble swallowing.   Eyes: Negative for photophobia and visual disturbance.  Respiratory: Negative for cough, chest tightness, shortness of breath, wheezing and stridor.   Cardiovascular: Negative for chest pain, palpitations and leg swelling.  Gastrointestinal: Negative for abdominal distention, abdominal pain, anal bleeding, blood in stool, constipation, diarrhea, nausea and vomiting.  Genitourinary: Negative for difficulty urinating, dysuria, flank pain and hematuria.  Musculoskeletal: Negative for arthralgias, back pain, gait problem, joint swelling and myalgias.  Skin: Negative for color change, pallor, rash and wound.  Neurological: Negative for dizziness, tremors, weakness and light-headedness.  Hematological: Negative for adenopathy. Does not bruise/bleed easily.  Psychiatric/Behavioral: Negative for agitation, behavioral problems, confusion, decreased concentration, dysphoric mood and sleep disturbance.       Objective:   Physical Exam Constitutional:      Appearance: He is well-developed.  HENT:     Head: Normocephalic and atraumatic.  Eyes:     Conjunctiva/sclera: Conjunctivae normal.  Cardiovascular:     Rate and Rhythm: Normal rate  and regular rhythm.  Pulmonary:     Effort: Pulmonary effort is normal. No respiratory distress.     Breath sounds: No wheezing.  Abdominal:     General: There is no distension.     Palpations: Abdomen is soft.  Musculoskeletal:        General: No tenderness. Normal range of motion.     Cervical back: Normal range of motion and neck supple.  Skin:    General: Skin is warm and dry.     Coloration: Skin is not pale.     Findings: No erythema or rash.  Neurological:     General: No focal deficit present.     Mental Status: He is alert and oriented to person, place, and time.  Psychiatric:        Mood and Affect: Mood normal.        Behavior: Behavior normal.        Thought Content: Thought content normal.        Judgment: Judgment normal.    Left side of the face is  much less swelling.  Try to take a picture of the oropharynx today November 09, 2020      PICC line November 09, 2020:          Assessment & Plan:   Osteomyelitis of the mandible: Continue ertapenem once he completes this start Augmentin.  It looks as if he is going to [facility] with [facility] and perhaps his prescriptions can be switched over to them once he is reestablished care there.  Would like to give him protracted oral antibiotics after he finishes IV antibiotics he should also follow-up with Dr. Hoyt Koch  I spent greater than 35 minutes with the patient including greater than 50% of time in face to face counsel of the patient and his wife regarding his condition and in coordination of his care with home health agency.

## 2020-11-13 ENCOUNTER — Other Ambulatory Visit (HOSPITAL_COMMUNITY): Payer: Self-pay

## 2020-11-16 ENCOUNTER — Ambulatory Visit: Payer: Self-pay | Admitting: Infectious Disease

## 2020-11-22 ENCOUNTER — Encounter: Payer: Self-pay | Admitting: Infectious Disease

## 2020-11-22 ENCOUNTER — Ambulatory Visit: Payer: Self-pay | Admitting: Nurse Practitioner

## 2020-11-23 ENCOUNTER — Encounter: Payer: Self-pay | Admitting: Nurse Practitioner

## 2020-11-23 ENCOUNTER — Other Ambulatory Visit: Payer: Self-pay

## 2020-11-23 ENCOUNTER — Ambulatory Visit: Payer: Self-pay | Attending: Nurse Practitioner | Admitting: Nurse Practitioner

## 2020-11-23 DIAGNOSIS — M272 Inflammatory conditions of jaws: Secondary | ICD-10-CM

## 2020-11-23 DIAGNOSIS — Z09 Encounter for follow-up examination after completed treatment for conditions other than malignant neoplasm: Secondary | ICD-10-CM

## 2020-11-23 NOTE — Progress Notes (Signed)
Virtual Visit via Telephone Note Due to national recommendations of social distancing due to COVID 19, telehealth visit is felt to be most appropriate for this patient at this time.  I discussed the limitations, risks, security and privacy concerns of performing an evaluation and management service by telephone and the availability of in person appointments. I also discussed with the patient that there may be a patient responsible charge related to this service. The patient expressed understanding and agreed to proceed.    I connected with Trevor Simmons on 11/23/20  at   4:10 PM EDT  EDT by telephone and verified that I am speaking with the correct person using two identifiers.   Consent I discussed the limitations, risks, security and privacy concerns of performing an evaluation and management service by telephone and the availability of in person appointments. I also discussed with the patient that there may be a patient responsible charge related to this service. The patient expressed understanding and agreed to proceed.   Location of Patient: Private Residence   Location of Provider: [facility]    Persons participating in Telemedicine visit: Trevor Simmons Trevor Simmons  Spanish interpreter ID [phone]   History of Present Illness: Telemedicine visit for: HFU He was admitted to the hospital with facial pain and diagnosed with osteomyelitis of the mandible requiring I&D of abscess.    Per Infectious Disease post hospital follow up note: 17 years ago had an infected wisdom tooth that had to be removed with placement of bone graft and was treated with nearly a year of antibiotics. He presented March 20 with acute onset of left-sided facial pain and was found on imaging to have osteomyelitis with a cystic lesion in tract that communicated with the wisdom tooth extraction site He is currently on IV ertapenem through advanced  home care and set to complete antibiotics in April at which point time he will need to transition over to oral antibiotics which I would like to be in the form of Augmentin.   He has no questions today. Doing well. Will see me in June for physical. Denies FEVER, worsening jaw pain, chest pain, shortness of breath, palpitations, lightheadedness, dizziness, headaches   I will be more than happy to refill his PO abx at the [facility] pharmacy. Will plan to see him here in the office for physical.     History reviewed. No pertinent past medical history.  Past Surgical History:  Procedure Laterality Date  . APPENDECTOMY    . I & D EXTREMITY Right 08/19/2017   Procedure: IRRIGATION AND DEBRIDEMENT SMALL FINGER;  Surgeon: Betha Loa, MD;  Location[facility];  Service: Orthopedics;  Laterality: Right;  . INCISION AND DRAINAGE ABSCESS Left 11/01/2020   Procedure: INCISION AND DRAINAGE ABSCESS POSSIBLE CYST REMOVAL MANDIBLE;  Surgeon: Ocie Doyne, DMD;  Location: [facility];  Service: Oral Surgery;  Laterality: Left;  . WISDOM TOOTH EXTRACTION      Family History  Problem Relation Age of Onset  . Diabetes Neg Hx   . Hypertension Neg Hx     Social History   Socioeconomic History  . Marital status: Married    Spouse name: Not on file  . Number of children: Not on file  . Years of education: Not on file  . Highest education level: Not on file  Occupational History  . Not on file  Tobacco Use  . Smoking status: Never Smoker  . Smokeless tobacco: Never Used  Vaping Use  . Vaping Use: Never used  Substance and Sexual Activity  . Alcohol use: No  . Drug use: No  . Sexual activity: Not Currently  Other Topics Concern  . Not on file  Social History Narrative  . Not on file   Social Determinants of Health   Financial Resource Strain: Not on file  Food Insecurity: Not on file  Transportation Needs: Not on file  Physical Activity: Not on file  Stress: Not on file  Social Connections: Not on file      Observations/Objective: Awake, alert and oriented x 3   Review of Systems  Constitutional: Negative for fever, malaise/fatigue and weight loss.  HENT: Negative.  Negative for nosebleeds.   Eyes: Negative.  Negative for blurred vision, double vision and photophobia.  Respiratory: Negative.  Negative for cough and shortness of breath.   Cardiovascular: Negative.  Negative for chest pain, palpitations and leg swelling.  Gastrointestinal: Negative.  Negative for heartburn, nausea and vomiting.  Musculoskeletal: Negative.  Negative for myalgias.  Neurological: Negative.  Negative for dizziness, focal weakness, seizures and headaches.  Psychiatric/Behavioral: Negative.  Negative for suicidal ideas.    Assessment and Plan: Diagnoses and all orders for this visit:  Hospital discharge follow-up  Osteomyelitis of jaw Continue abx as prescribed   Follow Up Instructions Return for FASTING labs and Physical.     I discussed the assessment and treatment plan with the patient. The patient was provided an opportunity to ask questions and all were answered. The patient agreed with the plan and demonstrated an understanding of the instructions.   The patient was advised to call back or seek an in-person evaluation if the symptoms worsen or if the condition fails to improve as anticipated.  I provided 12 minutes of non-face-to-face time during this encounter including median intraservice time, reviewing previous notes, labs, imaging, medications and explaining diagnosis and management.  Claiborne Rigg, Simmons

## 2020-11-24 ENCOUNTER — Telehealth: Payer: Self-pay | Admitting: *Deleted

## 2020-11-24 ENCOUNTER — Other Ambulatory Visit: Payer: Self-pay

## 2020-11-24 ENCOUNTER — Ambulatory Visit (INDEPENDENT_AMBULATORY_CARE_PROVIDER_SITE_OTHER): Payer: Self-pay | Admitting: Infectious Diseases

## 2020-11-24 ENCOUNTER — Encounter: Payer: Self-pay | Admitting: Infectious Diseases

## 2020-11-24 VITALS — BP 110/72 | HR 78 | Temp 97.9°F | Wt 158.0 lb

## 2020-11-24 DIAGNOSIS — Z5181 Encounter for therapeutic drug level monitoring: Secondary | ICD-10-CM | POA: Insufficient documentation

## 2020-11-24 DIAGNOSIS — Z452 Encounter for adjustment and management of vascular access device: Secondary | ICD-10-CM

## 2020-11-24 DIAGNOSIS — M272 Inflammatory conditions of jaws: Secondary | ICD-10-CM

## 2020-11-24 MED ORDER — AMOXICILLIN-POT CLAVULANATE 875-125 MG PO TABS
ORAL_TABLET | ORAL | 5 refills | Status: AC
  Filled 2020-11-24: qty 60, 30d supply, fill #0

## 2020-11-24 NOTE — Progress Notes (Signed)
Per [facility], augmentin prescription will be $10/month. RN sent prescription to [facility] pharmacy for fill today. Andree Coss, RN

## 2020-11-24 NOTE — Patient Instructions (Signed)
Please finish your IV antibiotics through 4/18.   Then start your antibiotic pill on 4/19 - one tablet twice a day.   Please continue to refill this every month until you come back.   If Dr. Barbette Merino has concerns at your next visit please call to see [place] sooner.   Will have you see Dr. Daiva Eves in 2 months.   If you notice sweating and feeling unwell the next time you give your IV antibiotic please call us - I will have you go ahead and switch to the pills and have the PICC line removed if that happens again.     Termine sus antibiticos intravenosos hasta el 4/18.  Luego comience su pastilla antibitica el 19/4: [place] Rast dos veces al da.  Contine rellenando esto todos los meses hasta que regrese.  Si el Dr. Barbette Merino tiene inquietudes en su prxima visita, llmenos para vernos antes.  Tendr que ver al Dr. Daiva Eves en 2 meses.   Si nota sudoracin y Camera operator prxima vez que administre su antibitico intravenoso, llmenos; har que contine y [place] a las pldoras y le quitarn la lnea PICC si eso vuelve a suceder.

## 2020-11-24 NOTE — Assessment & Plan Note (Signed)
He is feeling much better and has had no residual pain since his last office visit with Dr. Drucilla Schmidt.  He does still have some very mild residual swelling to the left mandible and restriction in range of motion of the mandible.  There is no residual findings of infection within the mouth.  Masticatory muscles are non-tender.  No bony tenderness over the mandible.  His lab work from the 11th reveals normalization of CRP and ESR with no leukocytosis.  I discussed with them that it appears that his infection is being well treated.  We will continue the plan with ertapenem through April 18 and transition to Augmentin twice daily.  We discussed the frequency and common side effects associated with this medication and the intention that he is to continue filling this until his next appointment with Dr. Drucilla Schmidt in 2 months.  He will in the interval see Dr. Hoyt Koch in early May.  I would suspect that he still has some time to fully recover from his surgery regarding the range of motion of the jaw and residual swelling.  I asked him to please come see [place] sooner if there is concern.

## 2020-11-24 NOTE — Assessment & Plan Note (Signed)
He had one episode of diaphoresis, nausea, feeling poorly after most recent infusion.  I told him if this happens again to please call us and we will go ahead and DC his PICC line on high suspicion it may be colonized with bacteria in the lumen and have him start his Augmentin a little early.  His wife assured me she recall and notify us.  We have already called and given orders to advanced team to pull on the 18th or earlier if he finds he has these recurrent symptoms.

## 2020-11-24 NOTE — Assessment & Plan Note (Signed)
All of his apparent lab work is been reviewed since onset of his treatment.  Inflammatory markers have stabilized, white cell count resolved and no signs of organ dysfunction or side effects/toxicities to current dose of antibiotic.

## 2020-11-24 NOTE — Progress Notes (Signed)
Patient: Trevor Simmons  DOB: 1981/02/04 MRN: [id number] PCP: Gildardo Pounds, NP     Subjective:  Trevor Simmons is a 40 y.o. male here for follow up on mandibular osteomyelitis.   Spanish interpreter via [employer] tablet was utilized for the visit. He is currently receiving Ertapenem through 4/18 via PICC line. Last seen by Dr. Tommy Medal on 3/29 - intraoperative cultures following I&D with Dr. Hoyt Koch growing Group F streptococcus.    Since his last office visit he reports no pain in his mouth.  He does feel some weird sensations however when he opens it fully and chews food.  He is clear that its not pain per se it just feels different than the other side.  He is not completely able to open his mouth fully yet.  He is no longer taking any pain medications for his infection. FU with Dr. Hoyt Koch is coming up in early May.   PICC line is without pain, drainage or erythema and is well maintained by [facility] Team. No swelling or altered sensation in affected distal extremity.  The only concern they shared today was yesterday during the infusion his wife reports that he started breaking out into a sweat and got nauseated and threw up.  The patient reports that he had a headache earlier that day and felt that this was more related to something else and it was just convenient timing.  He feels well today and has not had this episode since.   Review of Systems  Constitutional: Negative for chills and fever.  HENT: Negative for tinnitus.   Eyes: Negative for blurred vision and photophobia.  Respiratory: Negative for cough and sputum production.   Cardiovascular: Negative for chest pain.  Gastrointestinal: Negative for diarrhea, nausea and vomiting.  Genitourinary: Negative for dysuria.  Skin: Negative for rash.  Neurological: Negative for headaches.      Outpatient Medications Prior to Visit  Medication Sig Dispense Refill  . acetaminophen (TYLENOL) 500 MG tablet Take 500  mg by mouth every 6 (six) hours as needed for moderate pain or headache. (Patient not taking: Reported on 11/09/2020)    . ertapenem (INVANZ) IVPB Inject 1 g into the vein daily for 26 days. Indication:  Mandibular osteomyelitis  First Dose: no Last Day of Therapy:  11/29/2020 Labs - Once weekly:  CBC/D and BMP, Labs - Every other week:  ESR and CRP Method of administration: Mini-Bag Plus / Gravity Method of administration may be changed at the discretion of home infusion pharmacist based upon assessment of the patient and/or caregiver's ability to self-administer the medication ordered. 26 Units 0  . ondansetron (ZOFRAN ODT) 4 MG disintegrating tablet Take 1 tablet (4 mg total) by mouth every 8 (eight) hours as needed for nausea or vomiting. 20 tablet 0  . polyethylene glycol (MIRALAX / GLYCOLAX) 17 g packet Take 17 g by mouth daily as needed for mild constipation. (Patient not taking: Reported on 11/09/2020) 14 each 0  . amoxicillin-clavulanate (AUGMENTIN) 875-125 MG tablet TOME UNA TABLETA POR LA BOCA DOS VECES .AL DIA DESPUES DE QUE LOS ANTIBIOTICOS .IV ESTEN COMPLETOS 60 tablet 5   No facility-administered medications prior to visit.     No Known Allergies  Social History   Tobacco Use  . Smoking status: Never Smoker  . Smokeless tobacco: Never Used  Vaping Use  . Vaping Use: Never used  Substance Use Topics  . Alcohol use: No  . Drug use: No  Family History  Problem Relation Age of Onset  . Diabetes Neg Hx   . Hypertension Neg Hx     Objective:   Vitals:   11/24/20 1355  BP: 110/72  Pulse: 78  Temp: 97.9 F (36.6 C)  TempSrc: Oral  SpO2: 100%  Weight: 158 lb (71.7 kg)   Body mass index is 22.67 kg/m.  Physical Exam Vitals reviewed.  Constitutional:      Appearance: He is well-developed.     Comments: Seated comfortably in chair during visit.   HENT:     Mouth/Throat:     Dentition: Normal dentition. No dental abscesses.     Comments: He is not able to  fully open his mouth during exam.  There does not appear to be any drainage or erythema or fluctuant areas noted.  He has some very slight residual swelling to the left external jaw compared to the right.  There is no tenderness with palpating any masticatory muscles or directly over the jaw. Cardiovascular:     Rate and Rhythm: Normal rate and regular rhythm.     Heart sounds: Normal heart sounds.  Pulmonary:     Effort: Pulmonary effort is normal.     Breath sounds: Normal breath sounds.  Abdominal:     General: There is no distension.     Palpations: Abdomen is soft.     Tenderness: There is no abdominal tenderness.  Lymphadenopathy:     Cervical: No cervical adenopathy.  Skin:    General: Skin is warm and dry.     Findings: No rash.     Comments: Right upper extremity PICC line is clean dry and intact with a occlusive dressing overlying.  There is no cording or tenderness or drainage from the insertion site.  Neurological:     Mental Status: He is alert and oriented to person, place, and time.  Psychiatric:        Judgment: Judgment normal.     Comments: In good spirits today and engaged in care discussion.      Lab Results: Lab Results  Component Value Date   WBC 7.0 11/04/2020   HGB 12.6 (L) 11/04/2020   HCT 36.5 (L) 11/04/2020   MCV 83.0 11/04/2020   PLT 246 11/04/2020    Lab Results  Component Value Date   CREATININE 1.06 11/03/2020   BUN 13 11/03/2020   NA 136 11/03/2020   K 3.5 11/03/2020   CL 102 11/03/2020   CO2 28 11/03/2020    Lab Results  Component Value Date   ALT 13 10/31/2020   AST 14 (L) 10/31/2020   ALKPHOS 56 10/31/2020   BILITOT 1.2 10/31/2020     Assessment & Plan:   Problem List Items Addressed This Visit      Unprioritized   Therapeutic drug monitoring    All of his apparent lab work is been reviewed since onset of his treatment.  Inflammatory markers have stabilized, white cell count resolved and no signs of organ dysfunction or side  effects/toxicities to current dose of antibiotic.      PICC (peripherally inserted central catheter) in place    He had one episode of diaphoresis, nausea, feeling poorly after most recent infusion.  I told him if this happens again to please call us and we will go ahead and DC his PICC line on high suspicion it may be colonized with bacteria in the lumen and have him start his Augmentin a little early.  His wife assured me  she recall and notify us.  We have already called and given orders to advanced team to pull on the 18th or earlier if he finds he has these recurrent symptoms.      Osteomyelitis of jaw - Primary    He is feeling much better and has had no residual pain since his last office visit with Dr. Drucilla Schmidt.  He does still have some very mild residual swelling to the left mandible and restriction in range of motion of the mandible.  There is no residual findings of infection within the mouth.  Masticatory muscles are non-tender.  No bony tenderness over the mandible.  His lab work from the 11th reveals normalization of CRP and ESR with no leukocytosis.  I discussed with them that it appears that his infection is being well treated.  We will continue the plan with ertapenem through April 18 and transition to Augmentin twice daily.  We discussed the frequency and common side effects associated with this medication and the intention that he is to continue filling this until his next appointment with Dr. Drucilla Schmidt in 2 months.  He will in the interval see Dr. Hoyt Koch in early May.  I would suspect that he still has some time to fully recover from his surgery regarding the range of motion of the jaw and residual swelling.  I asked him to please come see [place] sooner if there is concern.      Relevant Medications   amoxicillin-clavulanate (AUGMENTIN) 875-125 MG tablet      Janene Madeira, MSN, NP-C [facility] for Infectious [place] Pager: [phone] Office:  [phone]  11/24/20  3:44 PM

## 2020-11-24 NOTE — Telephone Encounter (Signed)
Per Judeth Cornfield, verbal order relayed to [facility] at [facility] to pull picc at end of treatment. Last day of antibiotics 4/18. Per Judeth Cornfield, if patient has episode of sweating/nausea, ok to pull picc early. Patient aware. Patient will pick up augmentin today from [facility].  Andree Coss, RN

## 2020-11-25 NOTE — Progress Notes (Signed)
Thanks Steph!

## 2020-11-29 ENCOUNTER — Other Ambulatory Visit: Payer: Self-pay

## 2020-11-30 ENCOUNTER — Telehealth: Payer: Self-pay

## 2020-11-30 ENCOUNTER — Telehealth: Payer: Self-pay | Admitting: Infectious Disease

## 2020-11-30 NOTE — Telephone Encounter (Signed)
Patient called front desk requesting note for his hospitalization for work. Will route to provider.   Sandie Ano, RN

## 2020-11-30 NOTE — Telephone Encounter (Signed)
11/30/2020  To whom it may concern I have helped take care of Mr. Victorious Kundinger. Alexopoulos.  I met him when he was hospitalized at [facility] from March 20 through March 24.  We have treated him since then in the outpatient world and both myself and Janene Madeira have seen him in our clinic.   He should be excused from any and all work that he may have missed during the time that he was ill.  If you have further questions please do not hesitate to contact me.  Sincerely  [employer]. [facility]

## 2020-12-02 NOTE — Telephone Encounter (Signed)
RN spoke with patient, advised him that letter is at front desk for him to pick up. Patient states he can come Monday 12/06/20 to pick it up.   Sandie Ano, RN

## 2020-12-28 ENCOUNTER — Inpatient Hospital Stay: Payer: Self-pay | Admitting: Nurse Practitioner

## 2021-01-19 ENCOUNTER — Ambulatory Visit: Payer: Self-pay | Admitting: Infectious Disease

## 2021-02-09 ENCOUNTER — Encounter: Payer: Self-pay | Admitting: Nurse Practitioner

## 2021-03-25 ENCOUNTER — Encounter: Payer: Self-pay | Admitting: Nurse Practitioner

## 2022-01-27 ENCOUNTER — Other Ambulatory Visit: Payer: Self-pay

## 2022-11-19 IMAGING — CT CT MAXILLOFACIAL W/ CM
3 of 6 series · 14 of 47 positions shown, 17 images · IV contrast (APPLIED)
Comparison: None.

CLINICAL DATA: 39-year-old male with facial pain. Pain and swelling
anterior to the left ear. Denies fever.

EXAM:
CT MAXILLOFACIAL WITH CONTRAST
TECHNIQUE: Multidetector CT imaging of the maxillofacial structures was
performed with intravenous contrast. Multiplanar CT image
reconstructions were also generated.
CONTRAST:  75mL OMNIPAQUE IOHEXOL 300 MG/ML  SOLN

[Series 3: maxilllofacial 2.0 hr40 3 · axial · 0.40mm/px · z∈[-108,+58]mm · 9 of 97 slices shown, 12 images]
[im 7/97  brain]
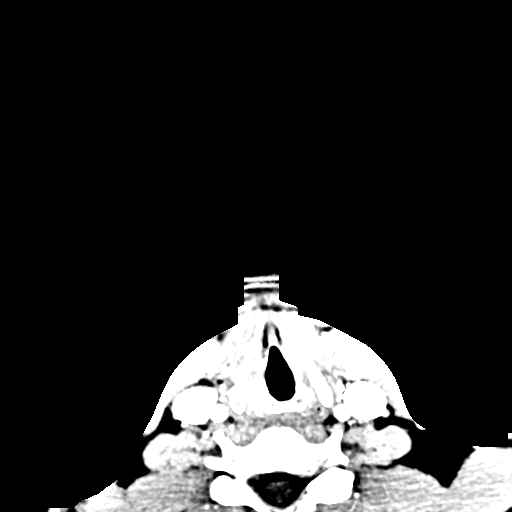
[im 7/97  bone]
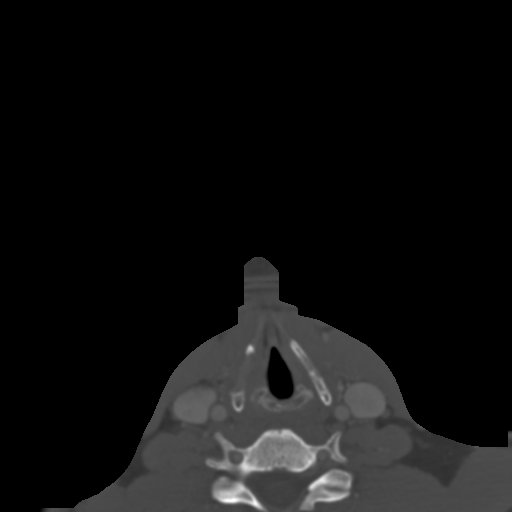
[im 21/97  bone]
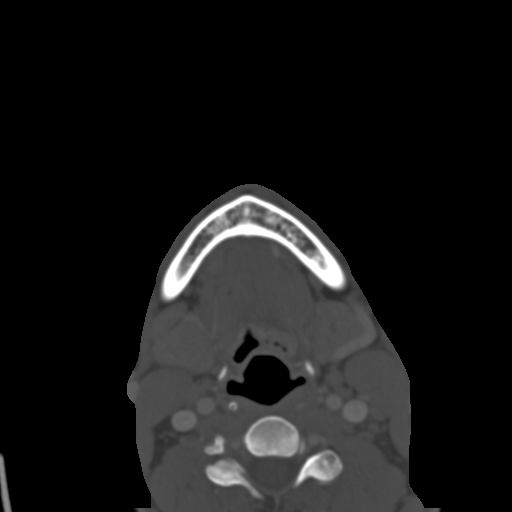
[im 28/97  bone]
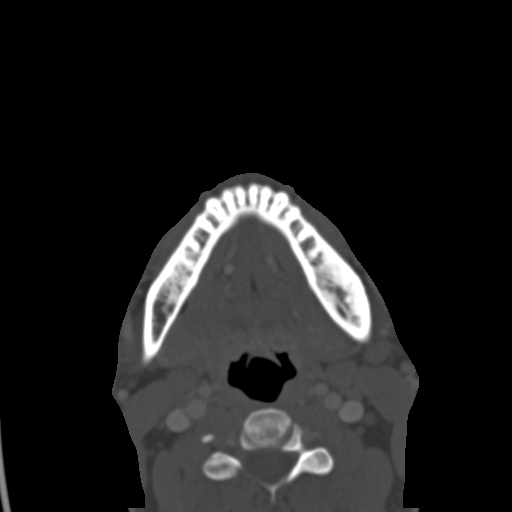
[im 42/97  bone]
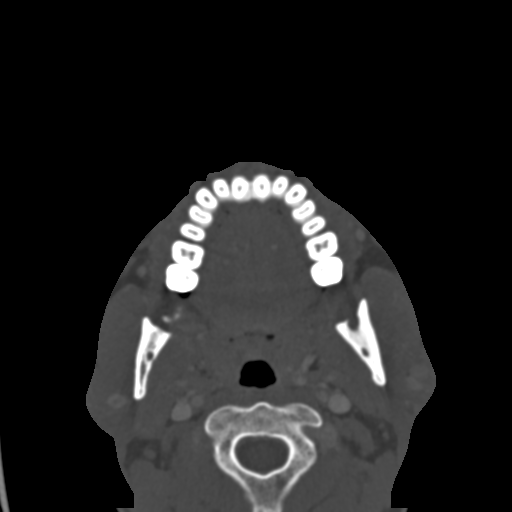
[im 49/97  brain]
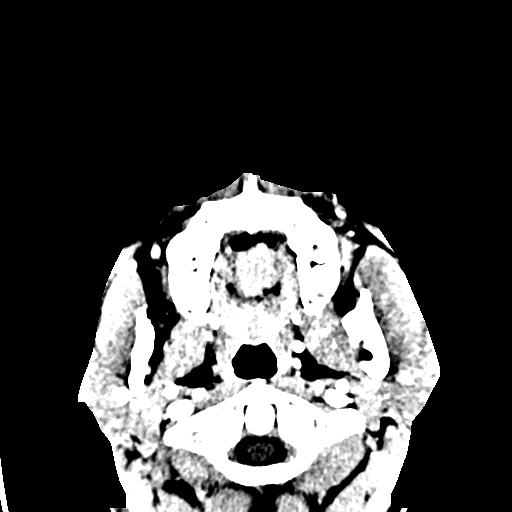
[im 49/97  bone]
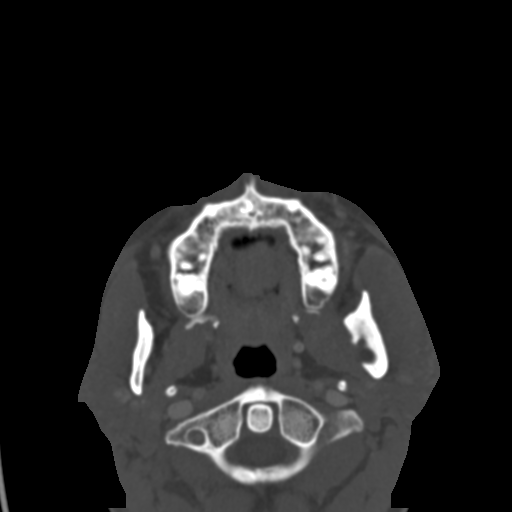
[im 55/97  bone]
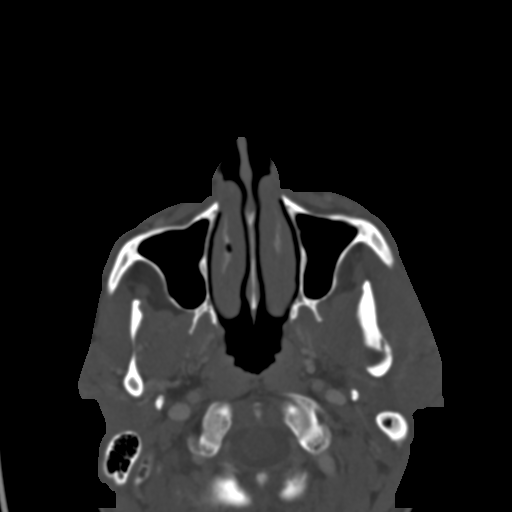
[im 69/97  bone]
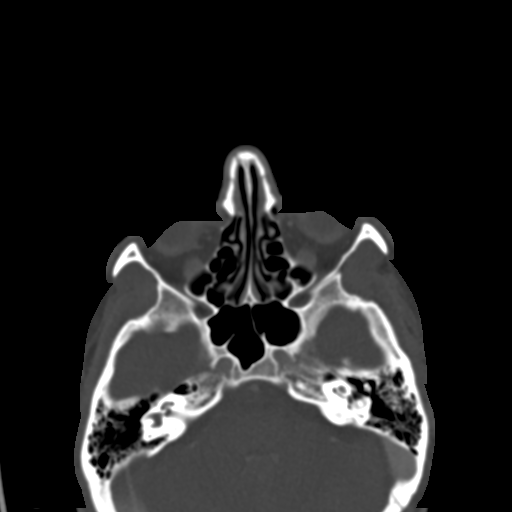
[im 76/97  bone]
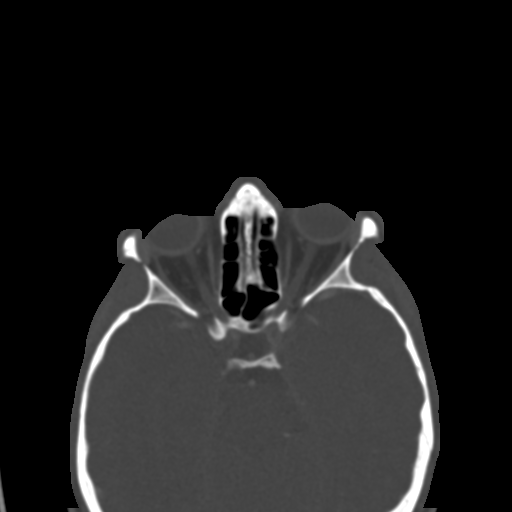
[im 90/97  brain]
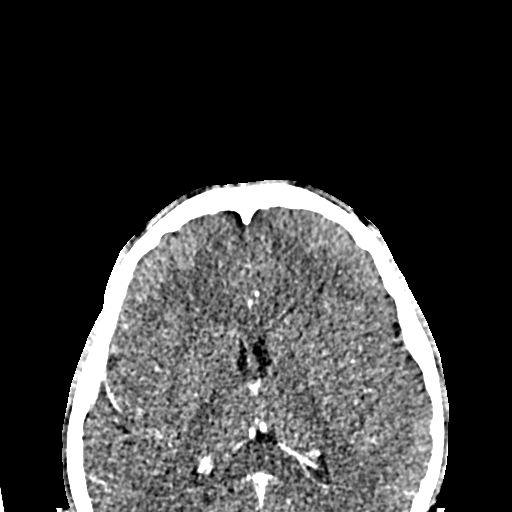
[im 90/97  bone]
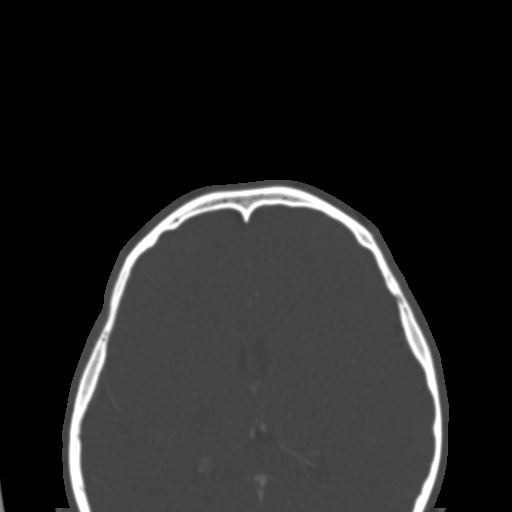

[Series 8: st sag · sagittal · 0.38mm/px · 2 of 110 slices shown]
[im 37/110  bone]
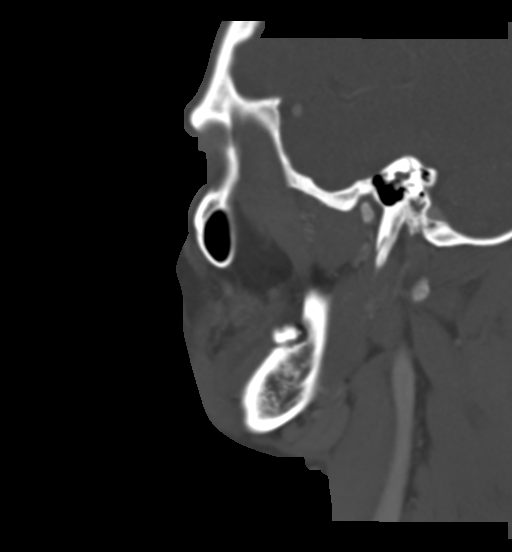
[im 73/110  bone]
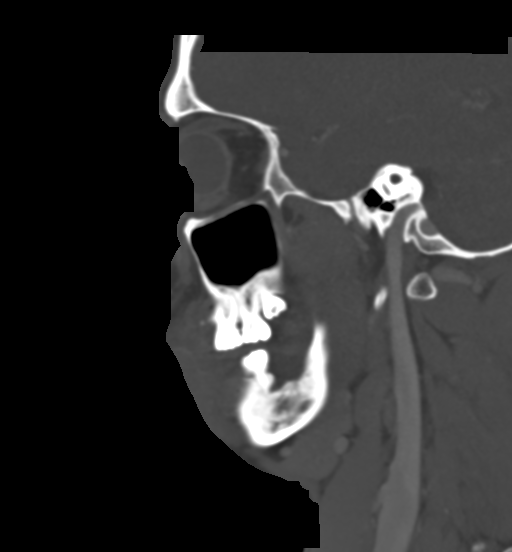

[Series 9: bone cor · coronal · 0.40mm/px · 3 of 85 slices shown]
[im 22/85  bone]
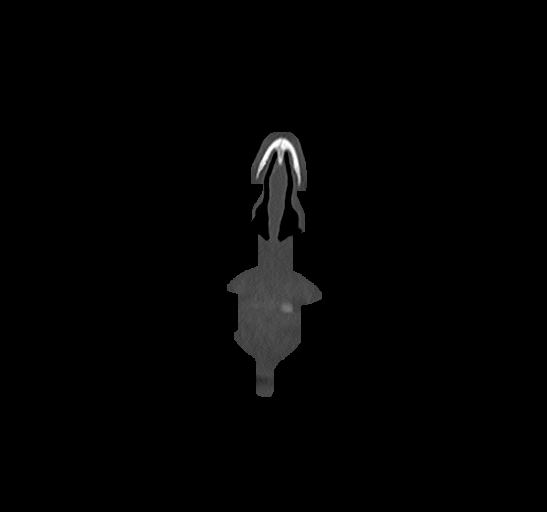
[im 43/85  bone]
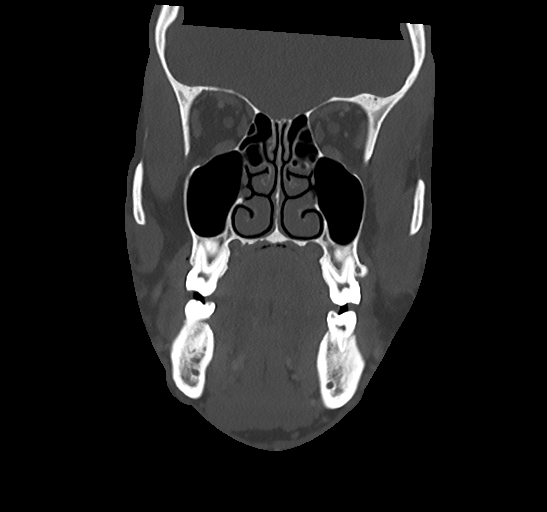
[im 64/85  bone]
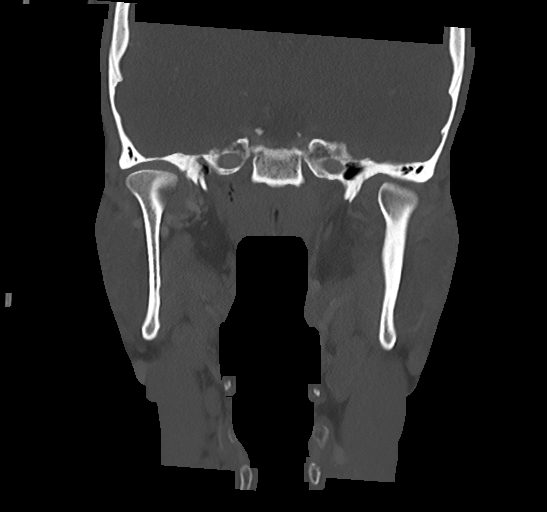

[14 of 47 positions shown; findings below may reference images not displayed]

FINDINGS: Osseous: Benign-appearing hypertrophic changes to the left lateral
maxillary alveolar process at the level of the left maxillary molars
on series 5, image 46. Superimposed mildly enlarged and sclerotic
left mandible ramus (series 9, image 55, series 5 images 31 through
41) and there is a circumscribed rounded area of bone remodeling
along the medial subcondylar region (series 5, image 51) where
hypodense area with relatively simple fluid density is present and
seems to communicate with the left mandible wisdom tooth extraction
site via an expanded bony canal (series 3, image 43). This seems to
be separate separate from although in proximity to the course for
the left inferior alveolar nerve.

Left TMJ remains located and seems unaffected. No periosteal
reaction. No left masticator space inflammation is evident.

Contralateral right mandible wisdom tooth is still in place.

No other significant dental finding identified. The remaining
mandible appears normal. Bone mineralization elsewhere appears
normal. Other facial bones intact. Visible central skull base and
cervical vertebrae appear intact.

Orbits: Intact orbital walls. Symmetric and negative orbits soft
tissues.

Sinuses: Clear.

Soft tissues: Marked area of clinical concern is the left lateral
face near the zygoma. No discrete soft tissue mass or abnormality in
that region. Nearby left parotid space and left masticator spaces
appear symmetric and within normal limits, although see left
mandible abnormality detailed above.

No Dilenia swelling.

Negative visible larynx, pharynx, parapharyngeal spaces,
retropharyngeal space, sublingual space, submandibular spaces. Major
vascular structures in the visible neck and at the skull base appear
patent and normal. No upper cervical lymphadenopathy.

Limited intracranial: Negative.
IMPRESSION: 1. No soft tissue space abnormality is identified, but deep to the
marked area of clinical concern there is an abnormally expanded and
sclerotic appearance of the left mandible ramus.

Although nonspecific, I suspect this may be a post infectious
sequelae such as chronic osteomyelitis related to extracted left
mandible wisdom tooth - as a small 10 mm round cystic area which has
smoothly remodeled the medial ramus appears to communicate with the
wisdom tooth extraction site.
A primary osteogenic tumor of the left mandible is also a
consideration but felt less likely.
A follow-up Face MRI without and with contrast would be helpful to
further characterize and exclude the possibility of active
osteomyelitis.

2. The mandible elsewhere appears negative. Negative face CT
otherwise.

## 2024-01-29 ENCOUNTER — Other Ambulatory Visit: Payer: Self-pay

## 2024-01-29 ENCOUNTER — Emergency Department (HOSPITAL_COMMUNITY): Payer: Self-pay

## 2024-01-29 ENCOUNTER — Encounter (HOSPITAL_COMMUNITY): Payer: Self-pay

## 2024-01-29 ENCOUNTER — Telehealth: Payer: Self-pay | Admitting: *Deleted

## 2024-01-29 ENCOUNTER — Telehealth (HOSPITAL_COMMUNITY): Payer: Self-pay | Admitting: Emergency Medicine

## 2024-01-29 ENCOUNTER — Emergency Department (HOSPITAL_COMMUNITY)
Admission: EM | Admit: 2024-01-29 | Discharge: 2024-01-29 | Disposition: A | Discharge status: Home / Self Care | Payer: Self-pay | Attending: Emergency Medicine | Admitting: Emergency Medicine

## 2024-01-29 DIAGNOSIS — K0889 Other specified disorders of teeth and supporting structures: Secondary | ICD-10-CM | POA: Insufficient documentation

## 2024-01-29 LAB — I-STAT CHEM 8, ED
BUN: 13 mg/dL (ref 6–20)
Calcium, Ion: 1.2 mmol/L (ref 1.15–1.40)
Chloride: 102 mmol/L (ref 98–111)
Creatinine, Ser: 1.1 mg/dL (ref 0.61–1.24)
Glucose, Bld: 95 mg/dL (ref 70–99)
HCT: 40 % (ref 39.0–52.0)
Hemoglobin: 13.6 g/dL (ref 13.0–17.0)
Potassium: 4 mmol/L (ref 3.5–5.1)
Sodium: 140 mmol/L (ref 135–145)
TCO2: 28 mmol/L (ref 22–32)

## 2024-01-29 LAB — BASIC METABOLIC PANEL WITH GFR
Anion gap: 10 (ref 5–15)
BUN: 11 mg/dL (ref 6–20)
CO2: 27 mmol/L (ref 22–32)
Calcium: 9.2 mg/dL (ref 8.9–10.3)
Chloride: 103 mmol/L (ref 98–111)
Creatinine, Ser: 0.94 mg/dL (ref 0.61–1.24)
GFR, Estimated: >60 mL/min (ref >60)
Glucose, Bld: 105 mg/dL — ABNORMAL HIGH (ref 70–99)
Potassium: 3.9 mmol/L (ref 3.5–5.1)
Sodium: 140 mmol/L (ref 135–145)

## 2024-01-29 LAB — CBC WITH DIFFERENTIAL/PLATELET
Abs Immature Granulocytes: 0.01 10*3/uL (ref 0.00–0.07)
Basophils Absolute: 0.1 10*3/uL (ref 0.0–0.1)
Basophils Relative: 1 %
Eosinophils Absolute: 0.5 10*3/uL (ref 0.0–0.5)
Eosinophils Relative: 6 %
HCT: 41.4 % (ref 39.0–52.0)
Hemoglobin: 13.6 g/dL (ref 13.0–17.0)
Immature Granulocytes: 0 %
Lymphocytes Relative: 20 %
Lymphs Abs: 1.6 10*3/uL (ref 0.7–4.0)
MCH: 28 pg (ref 26.0–34.0)
MCHC: 32.9 g/dL (ref 30.0–36.0)
MCV: 85.4 fL (ref 80.0–100.0)
Monocytes Absolute: 0.7 10*3/uL (ref 0.1–1.0)
Monocytes Relative: 8 %
Neutro Abs: 5.6 10*3/uL (ref 1.7–7.7)
Neutrophils Relative %: 65 %
Platelets: 234 10*3/uL (ref 150–400)
RBC: 4.85 MIL/uL (ref 4.22–5.81)
RDW: 12.4 % (ref 11.5–15.5)
WBC: 8.4 10*3/uL (ref 4.0–10.5)
nRBC: 0 % (ref 0.0–0.2)

## 2024-01-29 MED ORDER — MORPHINE SULFATE (PF) 4 MG/ML IV SOLN
4.0000 mg | Freq: Once | INTRAVENOUS | Status: AC
  Administered 2024-01-29: 4 mg via INTRAVENOUS
  Filled 2024-01-29: qty 1

## 2024-01-29 MED ORDER — OXYCODONE-ACETAMINOPHEN 5-325 MG PO TABS: 1.0000 | ORAL_TABLET | Freq: Four times a day (QID) | ORAL | 0 refills | Status: AC | PRN

## 2024-01-29 MED ORDER — IOHEXOL 350 MG/ML SOLN
75.0000 mL | Freq: Once | INTRAVENOUS | Status: AC | PRN
Start: 2024-01-29 — End: 2024-01-29
  Administered 2024-01-29: 75 mL via INTRAVENOUS

## 2024-01-29 MED ORDER — NAPROXEN 500 MG PO TABS
500.0000 mg | ORAL_TABLET | Freq: Two times a day (BID) | ORAL | 0 refills | Status: AC
Start: 2024-01-29 — End: 2024-02-05

## 2024-01-29 MED ORDER — KETOROLAC TROMETHAMINE 15 MG/ML IJ SOLN
15.0000 mg | Freq: Once | INTRAMUSCULAR | Status: AC
  Administered 2024-01-29: 15 mg via INTRAVENOUS
  Filled 2024-01-29: qty 1

## 2024-01-29 MED ORDER — AMOXICILLIN-POT CLAVULANATE 875-125 MG PO TABS: 1.0000 | ORAL_TABLET | Freq: Two times a day (BID) | ORAL | 0 refills | Status: AC

## 2024-01-29 NOTE — Telephone Encounter (Signed)
 PT daughter called regarding DDS telling them they needed an antibiotic from EDP.  RNCM relayed message to EDP who sent Rx promptly.  Allana Shrestha J. Rachel Budds, BSN, RN, NCM  Transitions of Care  Nurse Case Manager  [facility]  Operative Services  [phone]

## 2024-01-29 NOTE — Telephone Encounter (Cosign Needed)
 Dr. Daphene Dys called back and said to start patient on antibiotics - did not specify which one. Augmentin  sent to pharmacy.

## 2024-01-29 NOTE — ED Notes (Signed)
 Patient provided with discharge paperwork. Pt demonstrated understanding of material. All questions, comments, and concerns addressed. Pt ambulated in hallway towards exit with no complications

## 2024-01-29 NOTE — ED Triage Notes (Addendum)
 Pt to ED from home with c/o L sided jaw pain. Pt had jaw surgery 4 years ago and had an implant placed.Pt endorses pain to the R side for the past month getting progressively worse. VSS, NADN.

## 2024-01-29 NOTE — ED Notes (Signed)
 Called ct to have images pushed over

## 2024-01-29 NOTE — ED Provider Notes (Signed)
 [place] EMERGENCY DEPARTMENT AT [facility] Provider Note   CSN: [id number] Arrival date & time: 01/29/24  1914     Patient presents with: Dental Pain   Trevor Simmons is a 43 y.o. male with a history of osteomyelitis of the left jaw presents the ED today for jaw pain.  Patient reports worsening left-sided jaw pain for the past several weeks with tenderness to touch and swelling.  He has not been taking anything for his pain.  Denies any fevers, throat swelling, or difficulty swallowing.  Has a history of surgery on the left jaw in 2022 for osteomyelitis.     Prior to Admission medications   Medication Sig Start Date End Date Taking? Authorizing Provider  naproxen  (NAPROSYN ) 500 MG tablet Take 1 tablet (500 mg total) by mouth 2 (two) times daily for 7 days. 01/29/24 02/05/24 Yes Sonnie Dusky, PA-C  oxyCODONE -acetaminophen  (PERCOCET/ROXICET) 5-325 MG tablet Take 1 tablet by mouth every 6 (six) hours as needed for up to 5 days for severe pain (pain score 7-10). 01/29/24 02/03/24 Yes Sonnie Dusky, PA-C  acetaminophen  (TYLENOL ) 500 MG tablet Take 500 mg by mouth every 6 (six) hours as needed for moderate pain or headache. Patient not taking: Reported on 11/09/2020    [provider]  amoxicillin -clavulanate (AUGMENTIN ) 875-125 MG tablet Take 1 tablet by mouth every 12 (twelve) hours for 7 days. 01/29/24 02/05/24  Sonnie Dusky, PA-C  ondansetron  (ZOFRAN  ODT) 4 MG disintegrating tablet Take 1 tablet (4 mg total) by mouth every 8 (eight) hours as needed for nausea or vomiting. 11/04/20   Katsadouros, Vasilios, MD  polyethylene glycol (MIRALAX  / GLYCOLAX ) 17 g packet Take 17 g by mouth daily as needed for mild constipation. Patient not taking: Reported on 11/09/2020 11/04/20   Katsadouros, Vasilios, MD    Allergies: Patient has no known allergies.    Review of Systems  HENT:  Positive for dental problem.   All other systems reviewed and are negative.   Updated Vital  Signs BP 117/86 (BP Location: Left Arm)   Pulse (!) 59   Temp 97.7 F (36.5 C) (Oral)   Resp 16   Wt 72 kg   SpO2 100%   BMI 22.78 kg/m   Physical Exam  (all labs ordered are listed, but only abnormal results are displayed) Labs Reviewed  BASIC METABOLIC PANEL WITH GFR - Abnormal; Notable for the following components:      Result Value   Glucose, Bld 105 (*)    All other components within normal limits  CBC WITH DIFFERENTIAL/PLATELET  I-STAT CHEM 8, ED    EKG: None  Radiology: CT Maxillofacial W Contrast Result Date: 01/29/2024 CLINICAL DATA:  43 year old male with jaw surgery approximately 4 years ago. Jaw pain for the last month which is increasing. EXAM: CT MAXILLOFACIAL WITH CONTRAST TECHNIQUE: Multidetector CT imaging of the maxillofacial structures was performed with intravenous contrast. Multiplanar CT image reconstructions were also generated. RADIATION DOSE REDUCTION: This exam was performed according to the departmental dose-optimization program which includes automated exposure control, adjustment of the mA and/or kV according to patient size and/or use of iterative reconstruction technique. CONTRAST:  75mL OMNIPAQUE  IOHEXOL  350 MG/ML SOLN COMPARISON:  Prior Face CT and MRI with contrast 10/31/2020. FINDINGS: Osseous: Abnormality of the left mandible ramus as described in 2022 has progressed. Circumscribed low-density cystic expansion of the bony ramus there, eggshell thin overlying lateral bony covering now encompasses 28 x 30 by 32 mm (AP by transverse by CC) and  is mildly lobulated. Internal simple fluid density. But smoothly eroded/expanded margins of the left mandible ramus are also chronically sclerotic as seen on series 12, image 76. In 2022 the cystic lesion was about 16 mm long axis. The left mandible condyle remains intact and aligned although with some interval degenerative condyle irregularity and sclerosis (series 12, image 77). And positive for similar new cystic  appearing excavation also of the posterior left mandible body (series 5, image 32) with thinning of the lateral cortex in an area which was abnormally sclerotic previously. This has combined fluid and fat density. The adjacent left mandible posterior molar is carious. Other bilateral dental caries including the anterior right maxillary molar, the left maxillary wisdom tooth. In the right side maxillary and mandible wisdom teeth also remain in place. No other acute osseous abnormality identified. Bilateral maxilla, zygoma, pterygoid, nasal bones and visible calvarium, skull base appear intact. Visible cervical vertebrae appear intact and aligned. Orbits: Stable, intact, negative. Sinuses: Visualized paranasal sinuses and mastoids are stable and well aerated. Soft tissues: No inflammation in the left masticator space adjacent to the abnormal posterior left mandible. The adjacent left parapharyngeal and parotid spaces also appear to remain normal. Negative visible thyroid, larynx, pharynx, retropharyngeal space, right parapharyngeal space, sublingual, submandibular, right masticator and parotid spaces. Visible major vascular structures in the bilateral neck, face, and at the skull base appear patent and unremarkable. And no change and nonenlarged upper cervical lymph nodes since 2022. Limited intracranial: Negative. IMPRESSION: 1. Substantially progressed since 2022 chronic abnormality of the left posterior mandible body and ramus, now with an up to 32 mm cystic mass abnormally expanding and thinning the ramus, internal simple fluid density. And new but much smaller left mandible wisdom tooth extraction site region cystic/low-density expansile mass. The two lesions are nearly contiguous, and similar to the 2022 CT and MRI the constellation favors chronic infectious/inflammatory sequelae (such as Brodie Abscess) rather than a Primary Neoplasm Of Bone. Recommend OMFS consultation. 2. Other superimposed dental caries  affecting posterior mandible and maxillary dentition bilaterally. No active inflammation or other complicating features in the face soft tissues by CT. Electronically Signed   By: Marlise Simpers M.D.   On: 01/29/2024 08:01   DG Orthopantogram Result Date: 01/29/2024 EXAM: ORTHOPANTOMOGRAM XRAY, 1 VIEW 01/29/2024 02:07:00 AM TECHNIQUE: Panorex view of the mandible. COMPARISON: MR face and maxillofacial CT dated 10/31/2020. CLINICAL HISTORY: Encounter for foreign body and pain, Pt to ED from home with c/o L sided jaw pain. Pt had jaw surgery 4 years ago and had an implant placed. Pt endorses pain to the R side for the past month getting progressively worse. VSS, NADN. FINDINGS: DENTAL: Multiple dental caries. Progressive cystic lesion in the left mandibular ramus with associated tract extending to the left mandibular body. Reportedly, the patient had a prior left jaw implant. Interval progression of an apical root abscess posterior to the left lower second molar, at the prior wisdom tooth extraction site. TMJ: No dislocation. SOFT TISSUES: The soft tissues are unremarkable. IMPRESSION: 1. Progressive cystic lesion in the left mandibular ramus with associated tract extending to the left mandibular body, in the setting of prior left jaw implant. 2. Interval progression of an apical root abscess posterior to the left lower second molar, at the prior wisdom tooth extraction site. 3. Dental surgical consultation is suggested. Electronically signed by: Zadie Herter MD 01/29/2024 02:22 AM EDT RP Workstation: [id number]     Procedures   Medications Ordered in the ED  morphine  (PF)  4 MG/ML injection 4 mg (4 mg Intravenous Given 01/29/24 0802)  iohexol  (OMNIPAQUE ) 350 MG/ML injection 75 mL (75 mLs Intravenous Contrast Given 01/29/24 0745)  ketorolac (TORADOL) 15 MG/ML injection 15 mg (15 mg Intravenous Given 01/29/24 0947)                                    Medical Decision Making Amount and/or Complexity of Data  Reviewed Labs: ordered. Radiology: ordered.  Risk Prescription drug management.   This patient presents to the ED for concern of dental pain, this involves an extensive number of treatment options, and is a complaint that carries with it a high risk of complications and morbidity.   Differential diagnosis includes: Dental fracture, abscess, pulpitis, etc.   Comorbidities  See HPI above   Additional History  Additional history obtained from prior records   Lab Tests  iStat Chem-8 ordered in triage and is unremarkable.   Imaging Studies  Orthopantogram x-ray and CT maxillofacial ordered in triage  I independently visualized and interpreted imaging which showed:  Orthopantogram showed progressive cystic lesion left mandibular ramus with associated tract extending to the left mandibular body, in setting of prior left jaw implant.  Apical root abscess posterior left second molar, prior wisdom tooth extraction site. Dental surgical consultation recommended. CT shows I agree with the radiologist interpretation   Consultations  I requested consultation with Dr. Daphene Dys with oral surgery at [facility], and discussed lab and imaging findings as well as pertinent plan - they recommend: see in clinic referral to Dr. Daphene Dys, early next week   Problem List / ED Course / Critical Interventions / Medication Management  Patient reports worsening left jaw pain for the past several weeks with new swelling.  States that he is having difficulty eating on the side of his mouth at this point.  Has not been taking anything for pain.  Is able to swallow his secretions.  No swelling of the oropharynx. History of osteomyelitis of the left jaw in 2022.  Was following with Dr. Juleen Oakland for this in the past. I ordered medications including: Toradol for swelling Morphine  for pain  Reevaluation of the patient after these medicines showed that the patient improved I consulted and spoke with Dr. Daphene Dys with  [facility] oral surgery.  He recommended having patient follow-up outpatient next week.  He called back several hours later and advised to start patient on antibiotics as well.   Social Determinants of Health  Language barrier   Test / Admission - Considered  He is stable and safe for discharge home. Return precautions given.    Final diagnoses:  Dentalgia    ED Discharge Orders          Ordered    naproxen  (NAPROSYN ) 500 MG tablet  2 times daily        01/29/24 1015    oxyCODONE -acetaminophen  (PERCOCET/ROXICET) 5-325 MG tablet  Every 6 hours PRN        01/29/24 1015               Sonnie Dusky, PA-C 01/29/24 1558    Rosealee Concha, MD 01/29/24 [phone]

## 2024-01-29 NOTE — Discharge Instructions (Addendum)
 As discussed, the oral surgery team wants you to follow up in office next week. I have provided their information above.  It looks like you saw Dr. Ascencion Lava here in [place] before.  Take naproxen  twice a day for the next day to help with pain and swelling. Take percocet every 6 hours as needed for break through pain - this medication is a narcotic and can cause drowsiness so do not take it then drive.  Return to the ED if symptoms worsen in the interim.
